# Patient Record
Sex: Male | Born: 1940 | Race: Black or African American | Hispanic: No | Smoking: Never smoker
Health system: Southern US, Community
[De-identification: ages and names within clinical notes are randomized; demographics above are authoritative.]

## PROBLEM LIST (undated history)

## (undated) DIAGNOSIS — I10 Essential (primary) hypertension: Secondary | ICD-10-CM

## (undated) DIAGNOSIS — K56609 Unspecified intestinal obstruction, unspecified as to partial versus complete obstruction: Secondary | ICD-10-CM

## (undated) DIAGNOSIS — I639 Cerebral infarction, unspecified: Secondary | ICD-10-CM

## (undated) DIAGNOSIS — J310 Chronic rhinitis: Secondary | ICD-10-CM

## (undated) DIAGNOSIS — I82409 Acute embolism and thrombosis of unspecified deep veins of unspecified lower extremity: Secondary | ICD-10-CM

## (undated) DIAGNOSIS — E7800 Pure hypercholesterolemia, unspecified: Secondary | ICD-10-CM

## (undated) DIAGNOSIS — Z7189 Other specified counseling: Secondary | ICD-10-CM

## (undated) DIAGNOSIS — D638 Anemia in other chronic diseases classified elsewhere: Secondary | ICD-10-CM

## (undated) DIAGNOSIS — K4030 Unilateral inguinal hernia, with obstruction, without gangrene, not specified as recurrent: Secondary | ICD-10-CM

## (undated) DIAGNOSIS — Z8669 Personal history of other diseases of the nervous system and sense organs: Secondary | ICD-10-CM

## (undated) HISTORY — DX: Essential (primary) hypertension: I10

## (undated) HISTORY — DX: Pure hypercholesterolemia, unspecified: E78.00

## (undated) HISTORY — DX: Unspecified intestinal obstruction, unspecified as to partial versus complete obstruction: K56.609

## (undated) HISTORY — DX: Personal history of other diseases of the nervous system and sense organs: Z86.69

## (undated) HISTORY — DX: Chronic rhinitis: J31.0

## (undated) HISTORY — DX: Other specified counseling: Z71.89

## (undated) HISTORY — DX: Unilateral inguinal hernia, with obstruction, without gangrene, not specified as recurrent: K40.30

## (undated) HISTORY — PX: NO PAST SURGERIES: SHX2092

## (undated) HISTORY — DX: Anemia in other chronic diseases classified elsewhere: D63.8

---

## 2012-03-02 ENCOUNTER — Other Ambulatory Visit: Payer: Self-pay | Admitting: Vascular Surgery

## 2012-03-02 LAB — BASIC METABOLIC PANEL
Anion Gap: 7 (ref 7–16)
BUN: 13 mg/dL (ref 7–18)
Calcium, Total: 9 mg/dL (ref 8.5–10.1)
Chloride: 106 mmol/L (ref 98–107)
Creatinine: 0.81 mg/dL (ref 0.60–1.30)
EGFR (Non-African Amer.): >60
Osmolality: 279 (ref 275–301)
Potassium: 3.6 mmol/L (ref 3.5–5.1)

## 2012-03-03 ENCOUNTER — Ambulatory Visit: Payer: Self-pay | Admitting: Vascular Surgery

## 2013-04-04 ENCOUNTER — Emergency Department: Payer: Self-pay | Admitting: Internal Medicine

## 2013-04-04 LAB — COMPREHENSIVE METABOLIC PANEL
Anion Gap: 9 (ref 7–16)
Bilirubin,Total: 1.1 mg/dL — ABNORMAL HIGH (ref 0.2–1.0)
Calcium, Total: 9.2 mg/dL (ref 8.5–10.1)
Co2: 23 mmol/L (ref 21–32)
Creatinine: 0.78 mg/dL (ref 0.60–1.30)
EGFR (African American): >60
EGFR (Non-African Amer.): >60
SGOT(AST): 26 U/L (ref 15–37)
SGPT (ALT): 21 U/L (ref 12–78)
Sodium: 134 mmol/L — ABNORMAL LOW (ref 136–145)

## 2013-04-04 LAB — URINALYSIS, COMPLETE
Bilirubin,UR: NEGATIVE
Glucose,UR: NEGATIVE mg/dL (ref 0–75)
Leukocyte Esterase: NEGATIVE
Nitrite: NEGATIVE
Protein: NEGATIVE
RBC,UR: NONE SEEN /HPF (ref 0–5)
Specific Gravity: 1.014 (ref 1.003–1.030)

## 2013-04-04 LAB — CBC
MCH: 30.2 pg (ref 26.0–34.0)
MCV: 89 fL (ref 80–100)
Platelet: 278 10*3/uL (ref 150–440)
RDW: 14 % (ref 11.5–14.5)

## 2013-08-19 IMAGING — CT CT ANGIOGRAPHY NECK
1 series · 12 of 14 positions shown · IV contrast (APPLIED)
Comparison: none

REASON FOR EXAM: STROKE   [facility] EYE
COMMENTS:

PROCEDURE:     KCT - KCT ANGIOGRAPHY NECK W/WO  - March 03, 2012  [DATE]
RESULT:     Indication: Stroke
Comparisons: None
TECHNIQUE: 100 ml of Isovue 370 was administered and the extracranial
carotid arteries bilaterally were scanned during the arterial phase from the
level of the superior portion of the frontal sinuses to the proximal neck.
These images were then transferred to the Siemens work station and were
subsequently reviewed utilizing 3-D reconstructions and MIP images.

[Series 6: 3 soft tissue · axial · 0.47mm/px · z∈[+349,+583]mm · 12 of 94 slices shown]
[im 8/94  soft-tissue]
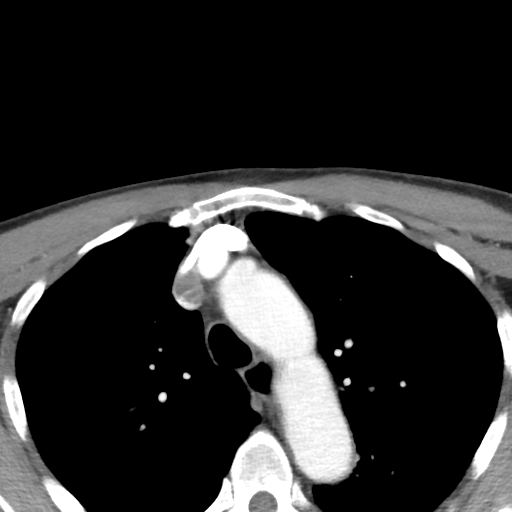
[im 15/94  bone]
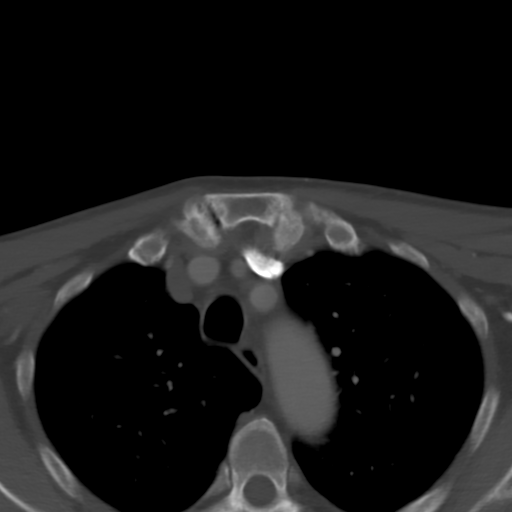
[im 22/94  soft-tissue]
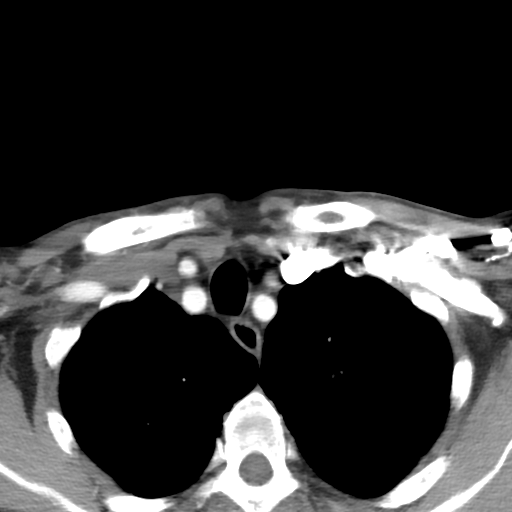
[im 29/94  bone]
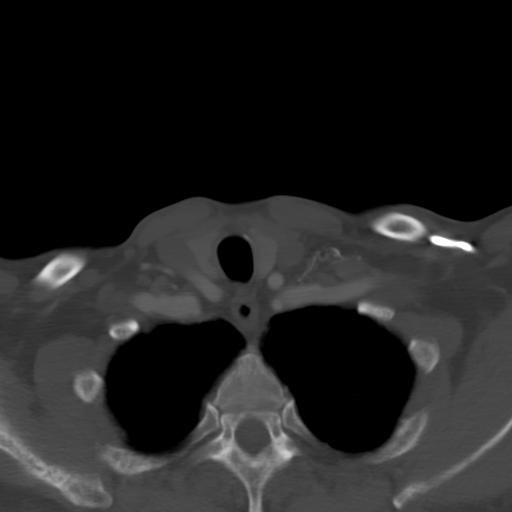
[im 36/94  soft-tissue]
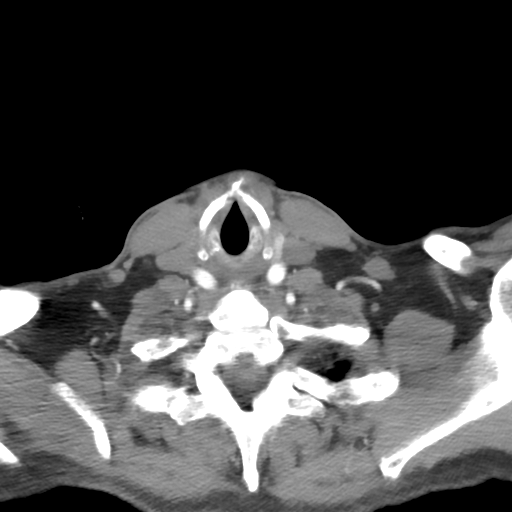
[im 43/94  bone]
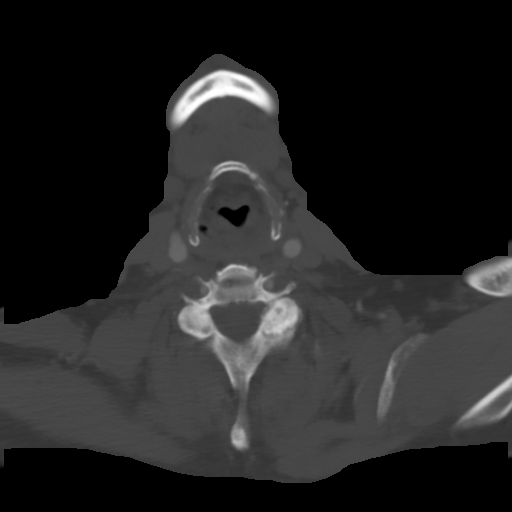
[im 51/94  soft-tissue]
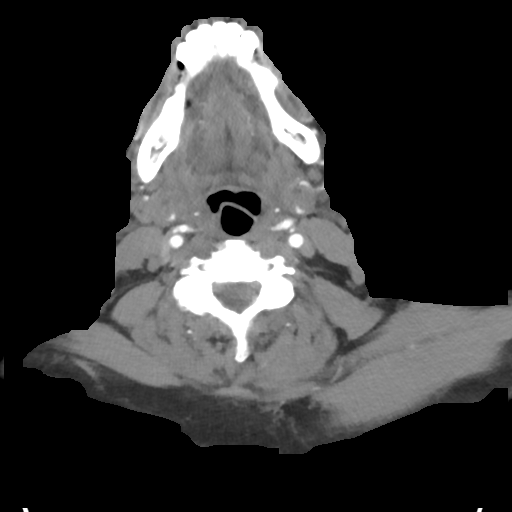
[im 58/94  bone]
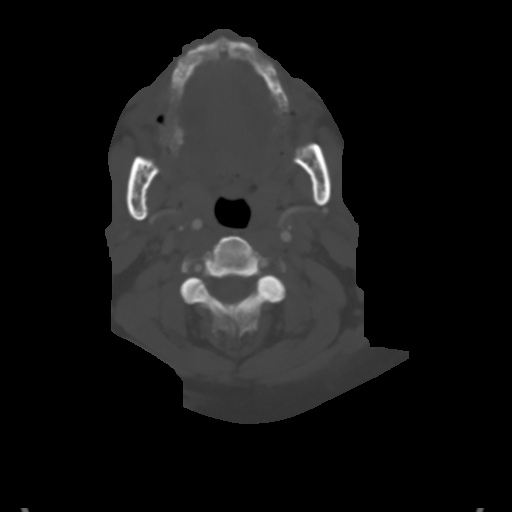
[im 65/94  soft-tissue]
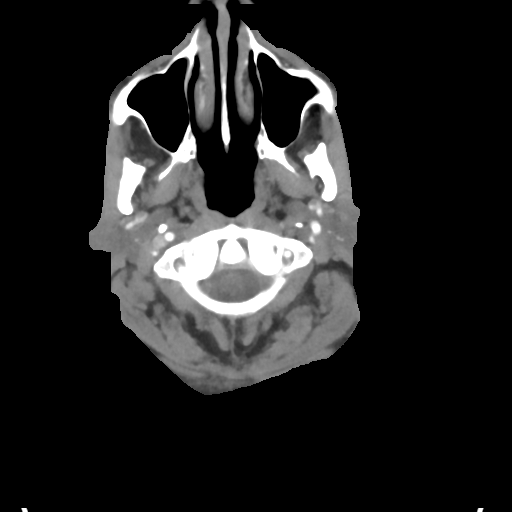
[im 72/94  bone]
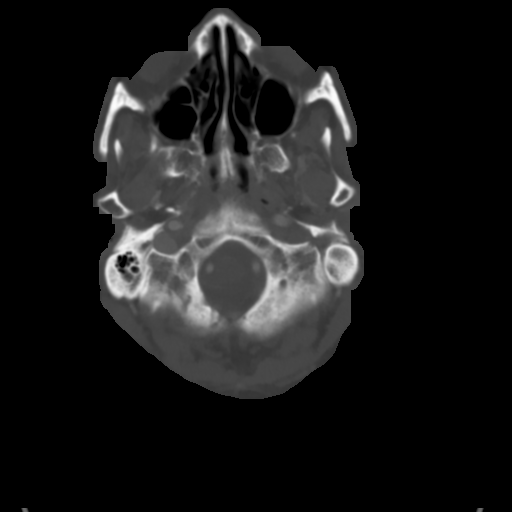
[im 79/94  soft-tissue]
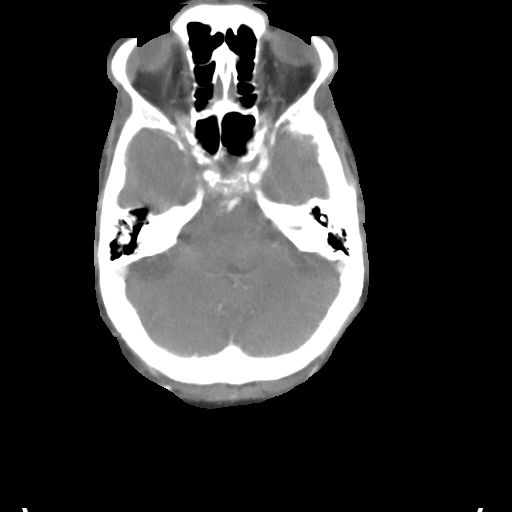
[im 86/94  bone]
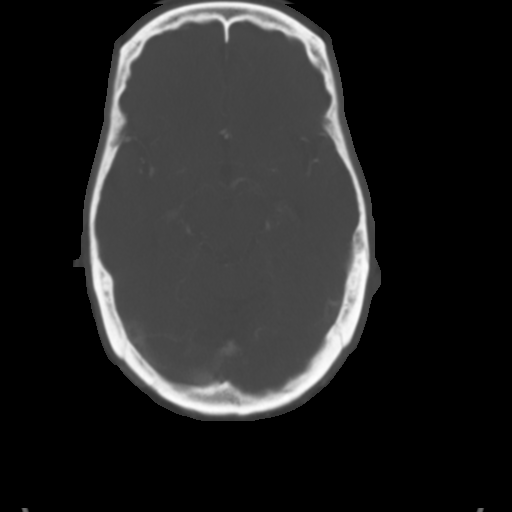

[12 of 14 positions shown; findings below may reference images not displayed]

FINDINGS: A three-vessel configuration of the aortic arch is identified with normal
ostium. The vertebral arteries are identified arising from the subclavian
arteries and entering the transverse foramen bilaterally at C6.

The carotid arteries are identified and are symmetric and unremarkable.
There is no evidence of aneurysm or carotid dissection bilaterally.

Right Carotid Artery: There is no significant atherosclerotic plaque. There
is no focal stenosis.

Left Carotid Artery: There is no significant atherosclerotic plaque. There
is no focal stenosis.

The visualized portions of the brain are unremarkable.

There is degenerative disc disease throughout the cervical spine.

The lung apices are clear.
IMPRESSION: 1. No significant carotid artery stenosis.

[REDACTED]

## 2014-09-20 IMAGING — CT CT HEAD WITHOUT CONTRAST
1 series · 15 of 30 positions shown, 19 images · non-contrast
Comparison: none

REASON FOR EXAM: fall, eye swollen shut, takes aspirin
COMMENTS:

PROCEDURE:     CT  - CT HEAD WITHOUT CONTRAST  - April 04, 2013  [DATE]
RESULT:     Comparison:  None
TECHNIQUE: Multiple axial images from the foramen magnum to the vertex were
obtained without IV contrast.

[Series 2: soft tissue · axial · 0.41mm/px · z∈[-56,+78]mm · 15 of 30 slices shown, 19 images]
[im 2/30  brain]
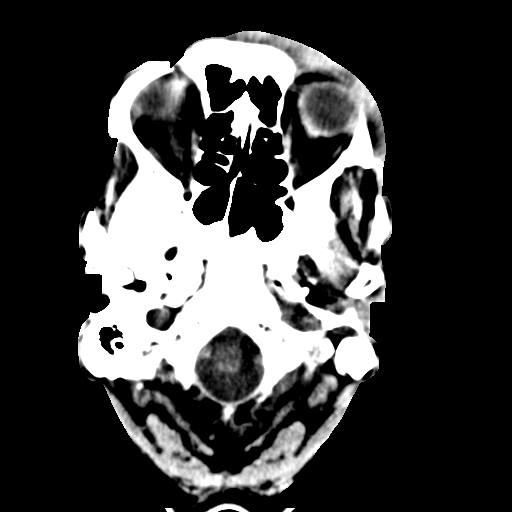
[im 2/30  bone]
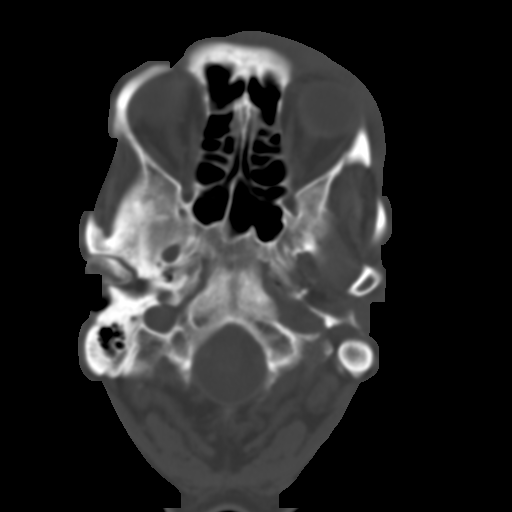
[im 4/30  brain]
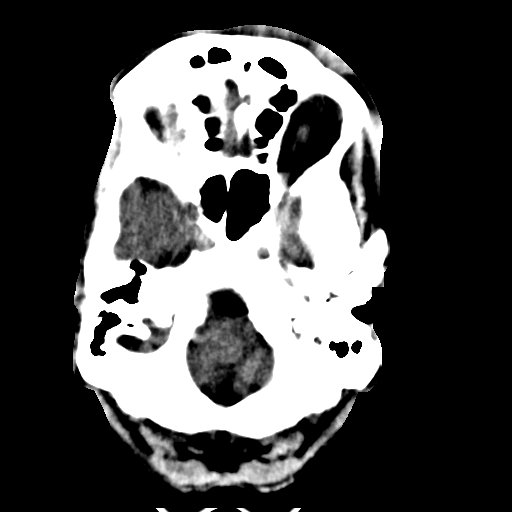
[im 6/30  brain]
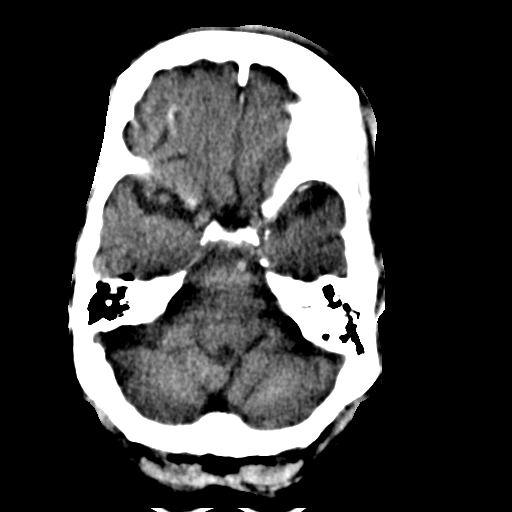
[im 8/30  brain]
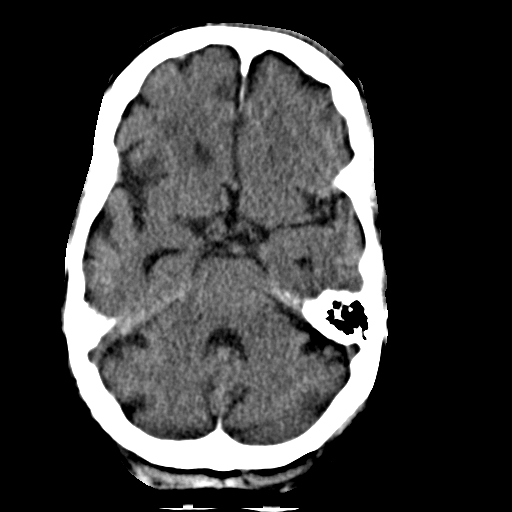
[im 10/30  brain]
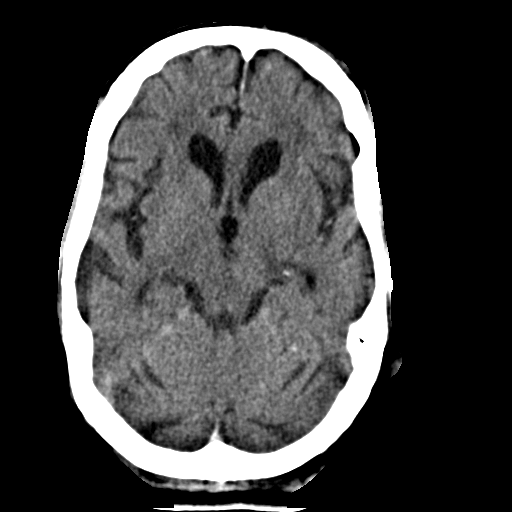
[im 10/30  bone]
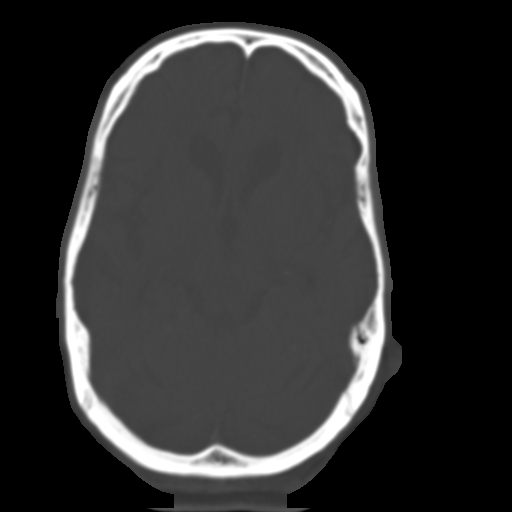
[im 12/30  brain]
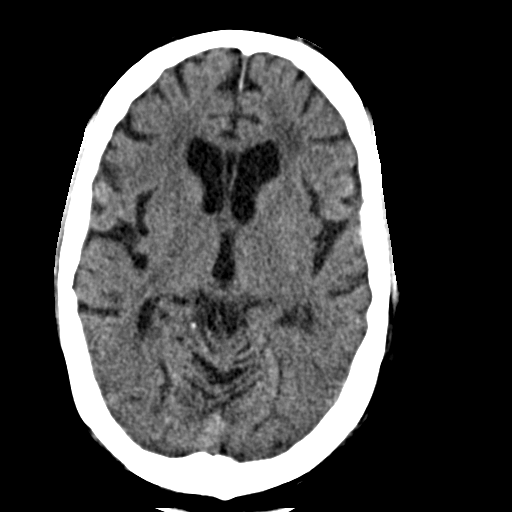
[im 14/30  brain]
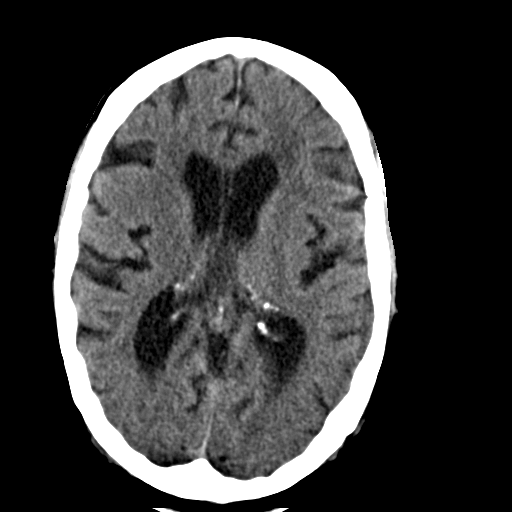
[im 16/30  brain]
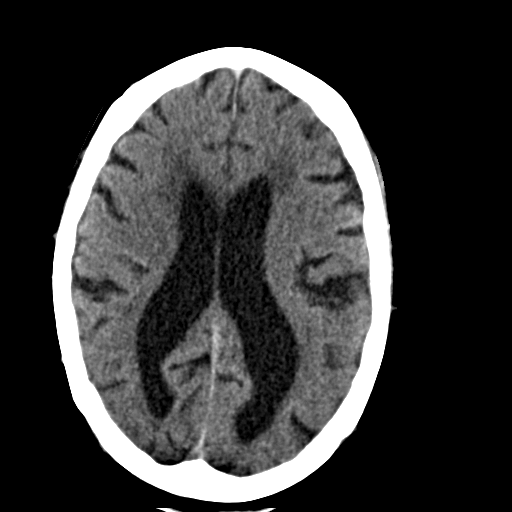
[im 17/30  brain]
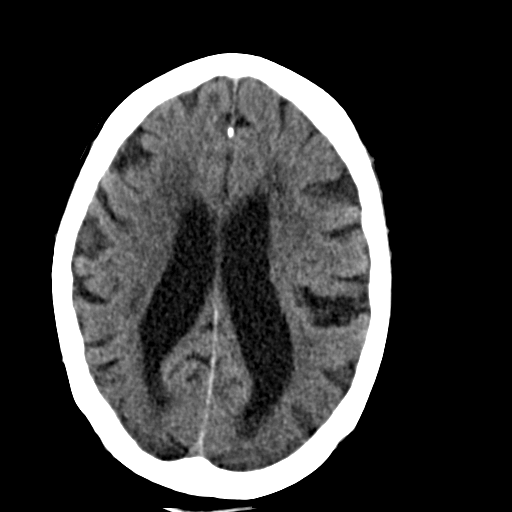
[im 17/30  bone]
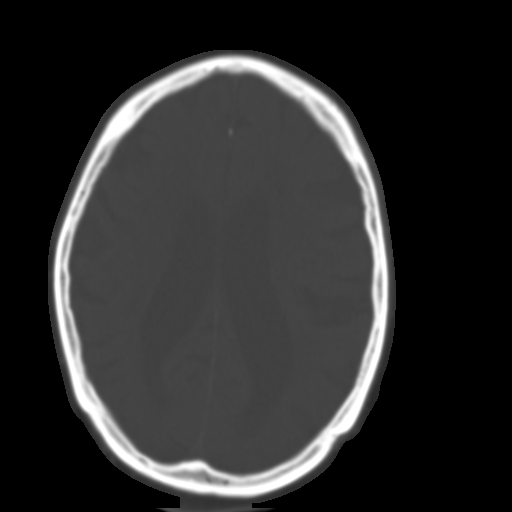
[im 19/30  brain]
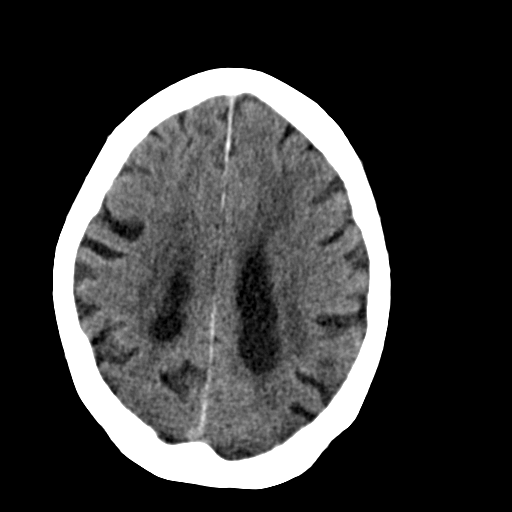
[im 21/30  brain]
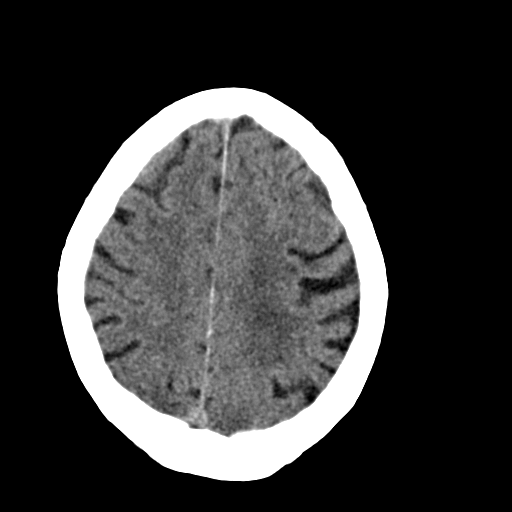
[im 23/30  brain]
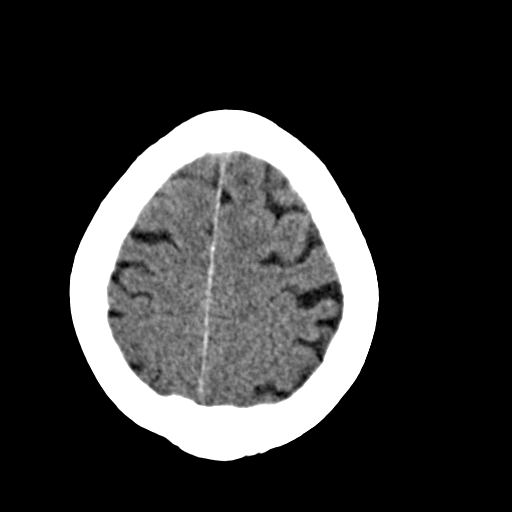
[im 25/30  brain]
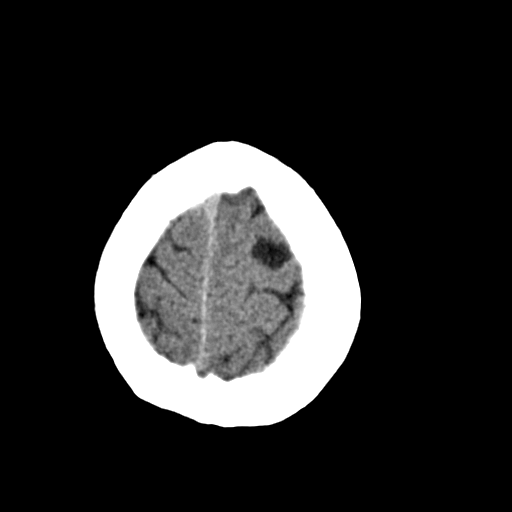
[im 25/30  bone]
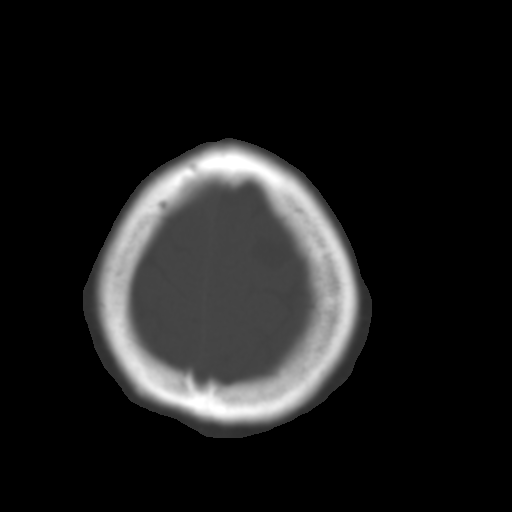
[im 27/30  brain]
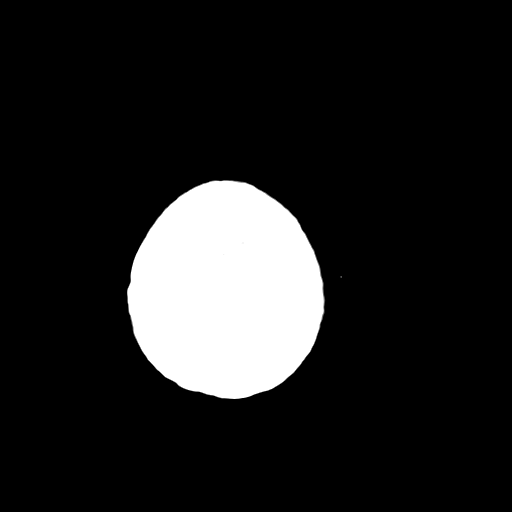
[im 29/30  brain]
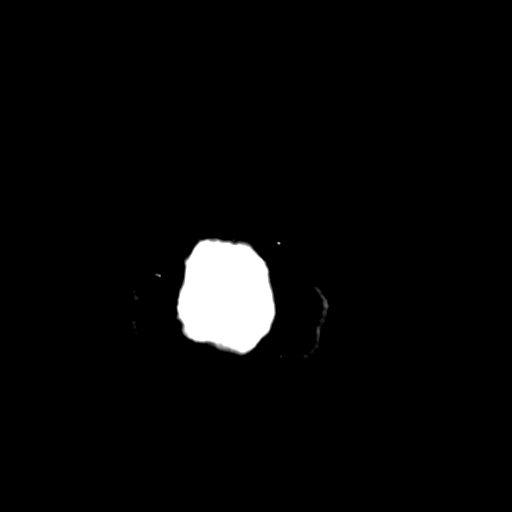

[15 of 30 positions shown; findings below may reference images not displayed]

FINDINGS: There is no evidence of mass effect, midline shift, or extra-axial fluid
collections.  There is no evidence of a space-occupying lesion or
intracranial hemorrhage. There is no evidence of a cortical-based area of
acute infarction. There is generalized cerebral atrophy. There is
periventricular white matter low attenuation likely secondary to
microangiopathy.

The ventricles and sulci are appropriate for the patient's age. The basal
cisterns are patent.

Visualized portions of the orbits are unremarkable. There is bilateral
ethmoid sinus and maxillary sinus mucosal thickening. Cerebrovascular
atherosclerotic calcifications are noted.

The osseous structures are unremarkable. There is severe left periorbital
soft tissue swelling.
IMPRESSION: No acute intracranial process.

[REDACTED]

## 2014-09-20 IMAGING — CT CT MAXILLOFACIAL WITHOUT CONTRAST
1 series · 12 of 30 positions shown, 15 images · non-contrast
Comparison: No comparison

REASON FOR EXAM: facial pain after fall
COMMENTS:

PROCEDURE:     CT  - CT MAXILLOFACIAL AREA WO  - April 04, 2013  [DATE]
RESULT:     History: Fall
TECHNIQUE: Multiple axial images obtained of the maxillofacial bones with
coronal reformatted images provided.

[Series 4: coronal · coronal · 0.29mm/px · 12 of 50 slices shown, 15 images]
[im 4/50  brain]
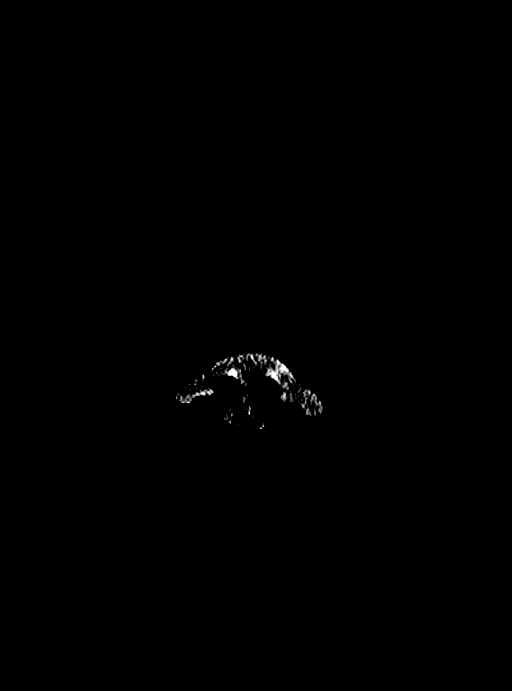
[im 4/50  bone]
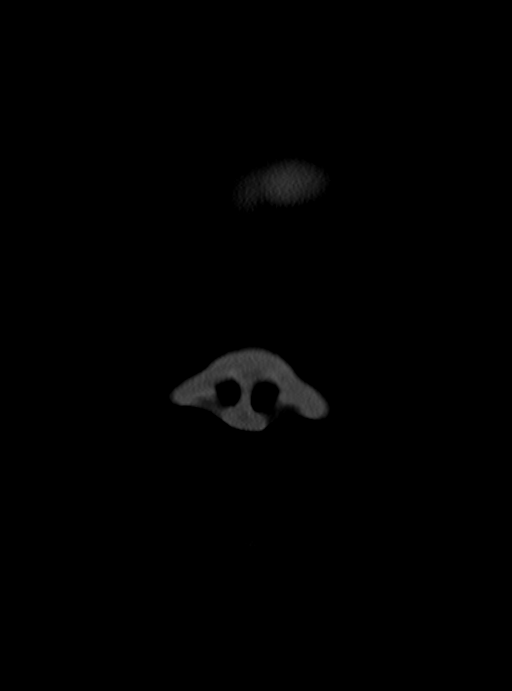
[im 7/50  bone]
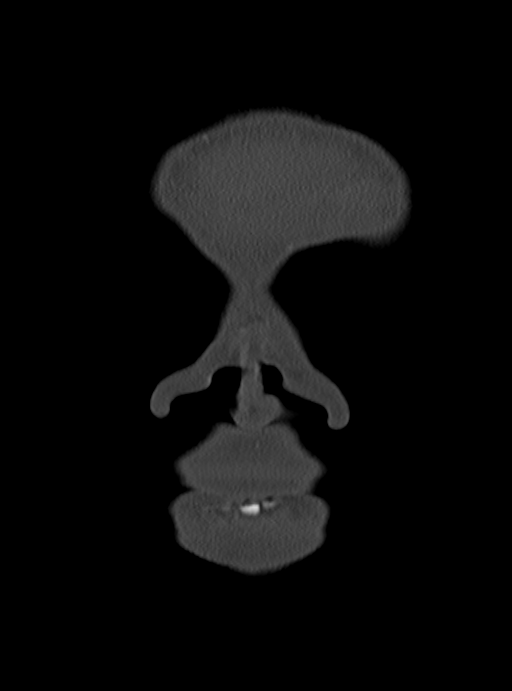
[im 11/50  bone]
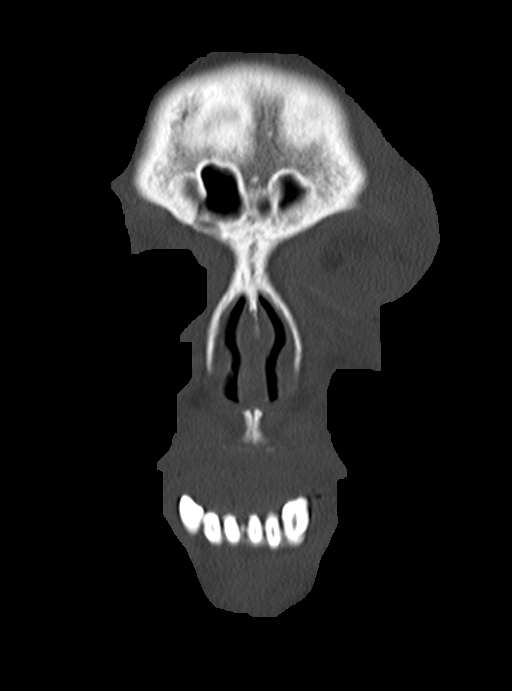
[im 16/50  bone]
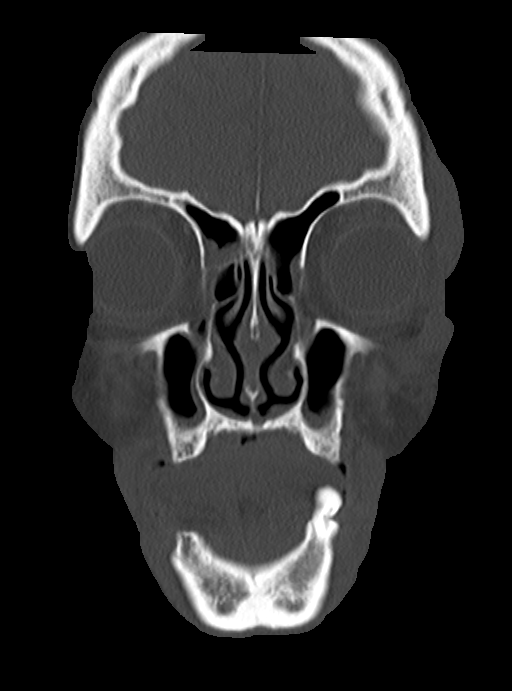
[im 19/50  brain]
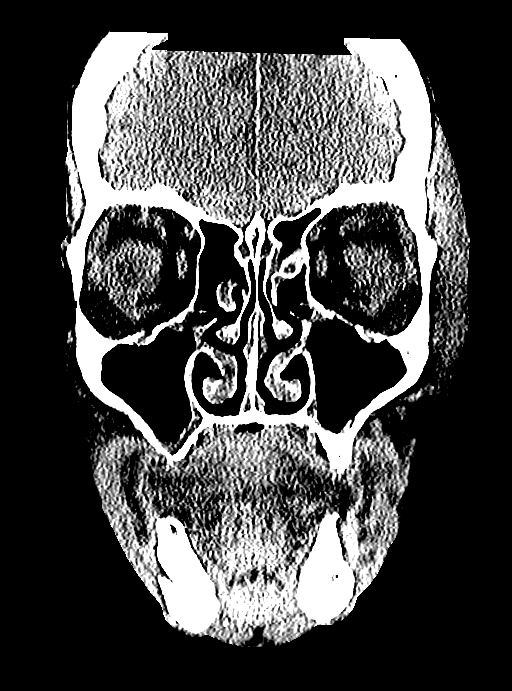
[im 19/50  bone]
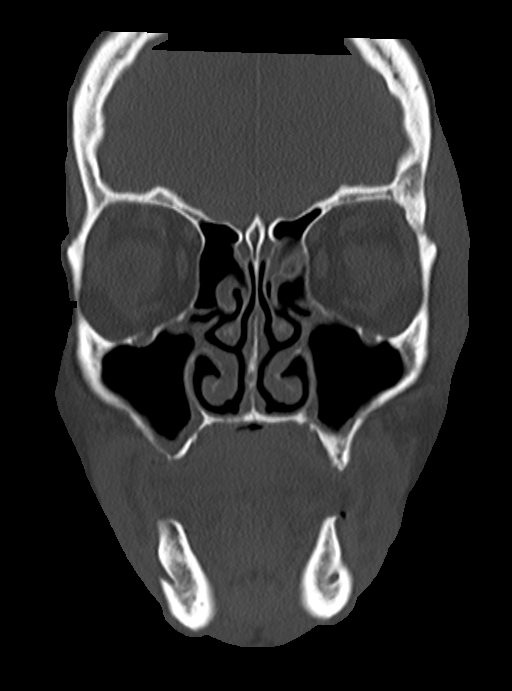
[im 22/50  bone]
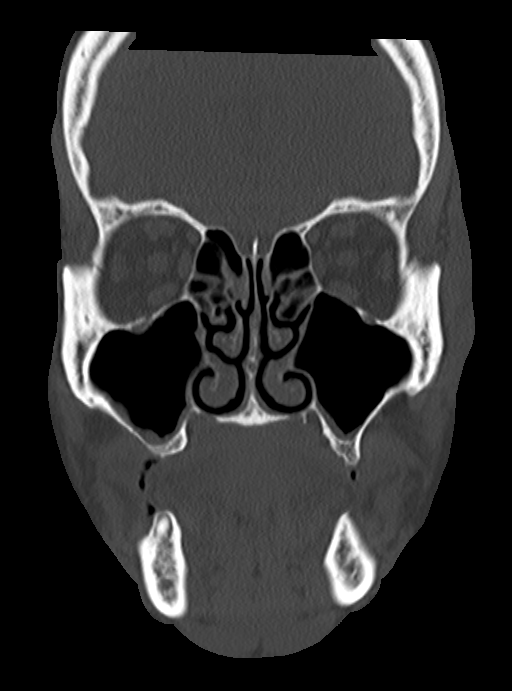
[im 28/50  bone]
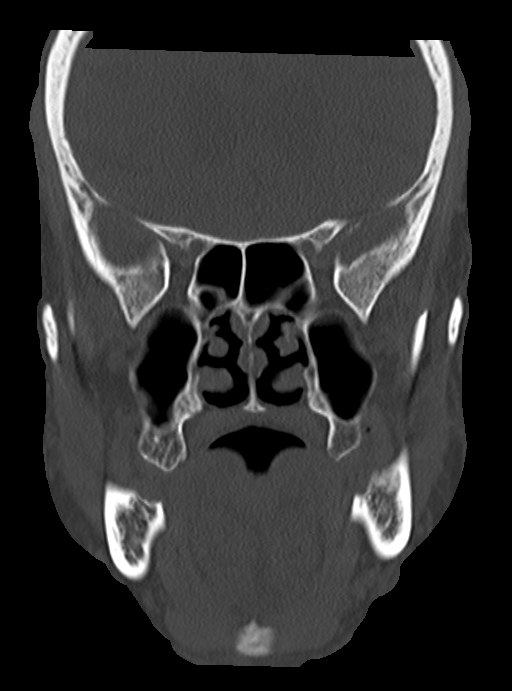
[im 31/50  bone]
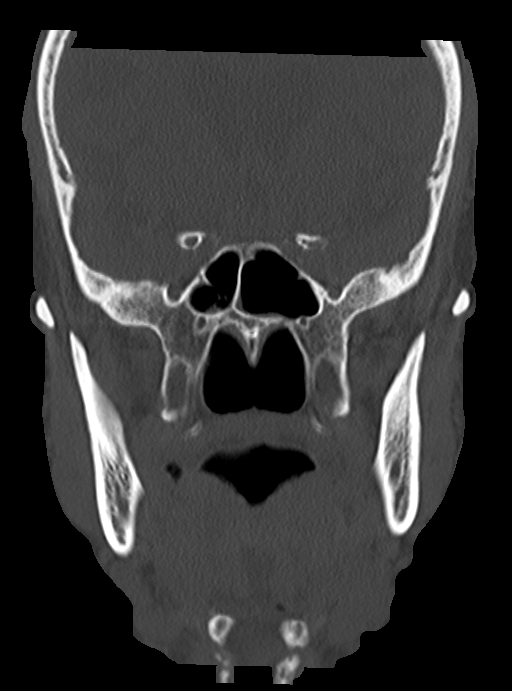
[im 34/50  brain]
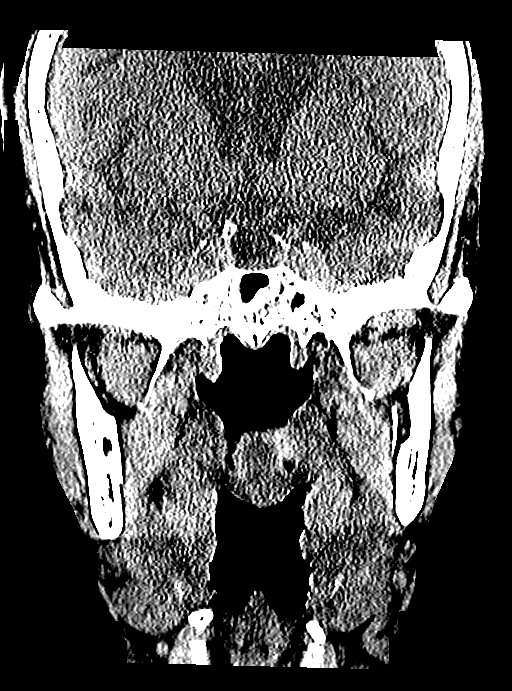
[im 34/50  bone]
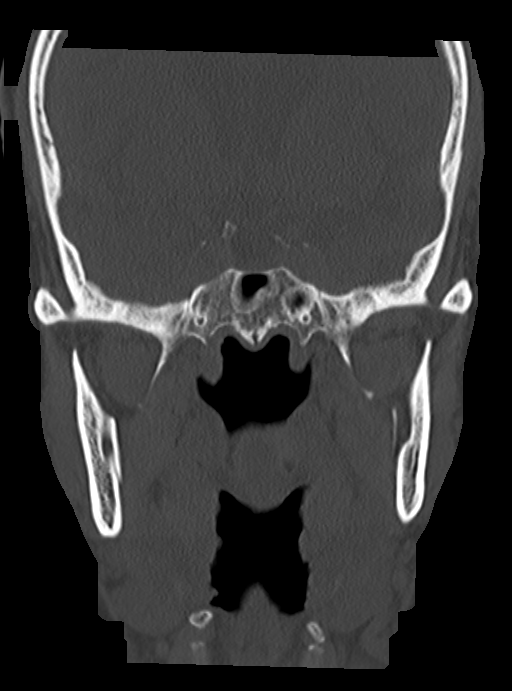
[im 39/50  bone]
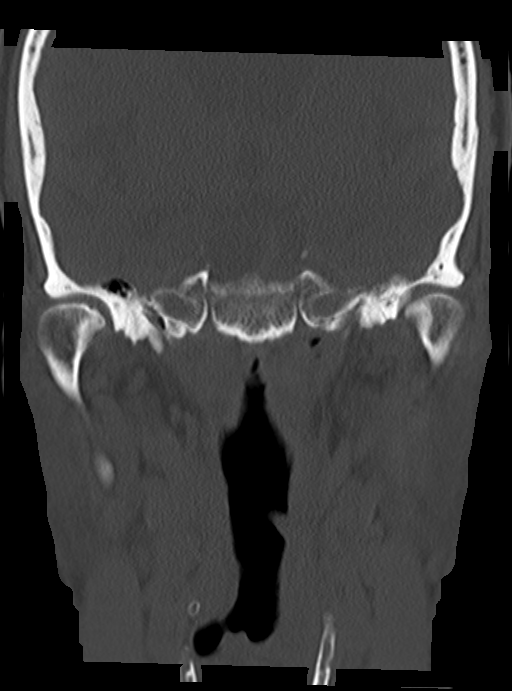
[im 43/50  bone]
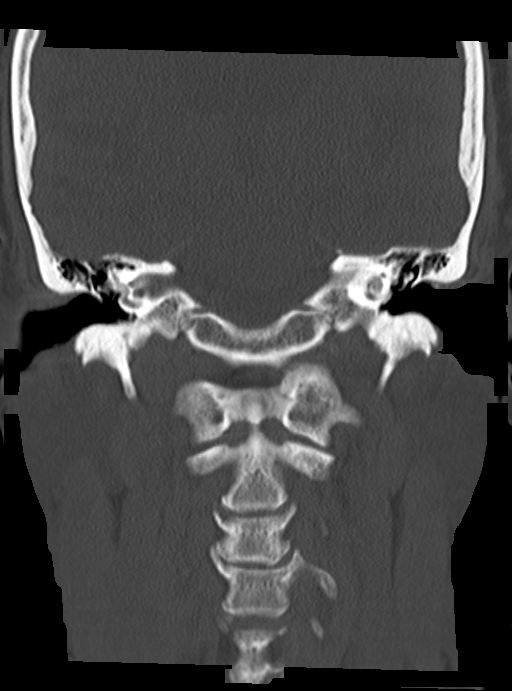
[im 46/50  bone]
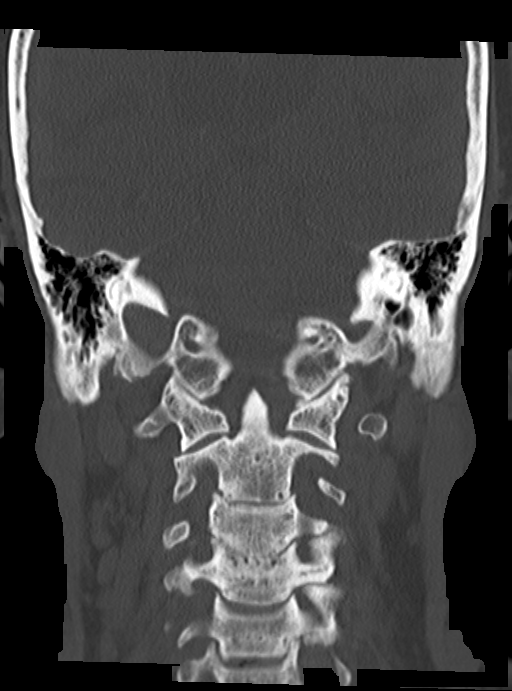

[12 of 30 positions shown; findings below may reference images not displayed]

FINDINGS: There is severe left periorbital soft tissue swelling. The globes are
intact. The orbital walls are intact. The orbital floor is intact. The
maxilla and mandible are intact. The zygomatic arches are intact. The nasal
septum is midline. There is no nasal bone fracture. The temporomandibular
joints are normal.

There is bilateral ethmoid sinus and maxillary sinus mucosal thickening. The
visualized portions of the mastoid sinuses are well aerated.
IMPRESSION: No acute osseous injury of the maxillofacial bones.

[REDACTED]

## 2015-11-21 DIAGNOSIS — D638 Anemia in other chronic diseases classified elsewhere: Secondary | ICD-10-CM | POA: Insufficient documentation

## 2015-11-21 HISTORY — DX: Anemia in other chronic diseases classified elsewhere: D63.8

## 2016-07-29 DIAGNOSIS — Z7189 Other specified counseling: Secondary | ICD-10-CM | POA: Insufficient documentation

## 2016-07-29 DIAGNOSIS — Z7185 Encounter for immunization safety counseling: Secondary | ICD-10-CM

## 2016-07-29 HISTORY — DX: Encounter for immunization safety counseling: Z71.85

## 2017-09-12 ENCOUNTER — Emergency Department: Payer: Medicare Other

## 2017-09-12 ENCOUNTER — Other Ambulatory Visit: Payer: Self-pay

## 2017-09-12 ENCOUNTER — Emergency Department
Admission: EM | Admit: 2017-09-12 | Discharge: 2017-09-12 | Disposition: A | Discharge status: Home / Self Care | Payer: Medicare Other | Attending: Emergency Medicine | Admitting: Emergency Medicine

## 2017-09-12 DIAGNOSIS — K4030 Unilateral inguinal hernia, with obstruction, without gangrene, not specified as recurrent: Secondary | ICD-10-CM | POA: Diagnosis not present

## 2017-09-12 DIAGNOSIS — K56609 Unspecified intestinal obstruction, unspecified as to partial versus complete obstruction: Secondary | ICD-10-CM | POA: Insufficient documentation

## 2017-09-12 DIAGNOSIS — I10 Essential (primary) hypertension: Secondary | ICD-10-CM | POA: Insufficient documentation

## 2017-09-12 DIAGNOSIS — R1010 Upper abdominal pain, unspecified: Secondary | ICD-10-CM | POA: Diagnosis present

## 2017-09-12 DIAGNOSIS — Z87891 Personal history of nicotine dependence: Secondary | ICD-10-CM | POA: Insufficient documentation

## 2017-09-12 HISTORY — DX: Essential (primary) hypertension: I10

## 2017-09-12 LAB — CBC
HCT: 41.7 % (ref 40.0–52.0)
Hemoglobin: 13.6 g/dL (ref 13.0–18.0)
MCH: 29.9 pg (ref 26.0–34.0)
MCHC: 32.7 g/dL (ref 32.0–36.0)
MCV: 91.5 fL (ref 80.0–100.0)
PLATELETS: 243 10*3/uL (ref 150–440)
RBC: 4.56 MIL/uL (ref 4.40–5.90)
RDW: 14.4 % (ref 11.5–14.5)
WBC: 6 10*3/uL (ref 3.8–10.6)

## 2017-09-12 LAB — COMPREHENSIVE METABOLIC PANEL
ALK PHOS: 39 U/L (ref 38–126)
ALT: 16 U/L — AB (ref 17–63)
AST: 26 U/L (ref 15–41)
Albumin: 4.3 g/dL (ref 3.5–5.0)
Anion gap: 13 (ref 5–15)
BILIRUBIN TOTAL: 2.3 mg/dL — AB (ref 0.3–1.2)
BUN: 10 mg/dL (ref 6–20)
CALCIUM: 9.2 mg/dL (ref 8.9–10.3)
CO2: 20 mmol/L — ABNORMAL LOW (ref 22–32)
CREATININE: 0.85 mg/dL (ref 0.61–1.24)
Chloride: 106 mmol/L (ref 101–111)
GFR calc Af Amer: >60 mL/min (ref >60)
Glucose, Bld: 155 mg/dL — ABNORMAL HIGH (ref 65–99)
Potassium: 3.2 mmol/L — ABNORMAL LOW (ref 3.5–5.1)
Sodium: 139 mmol/L (ref 135–145)
TOTAL PROTEIN: 7.4 g/dL (ref 6.5–8.1)

## 2017-09-12 LAB — LIPASE, BLOOD: Lipase: 21 U/L (ref 11–51)

## 2017-09-12 MED ORDER — SODIUM CHLORIDE 0.9 % IV BOLUS (SEPSIS)
1000.0000 mL | Freq: Once | INTRAVENOUS | Status: AC
  Administered 2017-09-12: 1000 mL via INTRAVENOUS

## 2017-09-12 MED ORDER — IOPAMIDOL (ISOVUE-300) INJECTION 61%
100.0000 mL | Freq: Once | INTRAVENOUS | Status: AC | PRN
  Administered 2017-09-12: 100 mL via INTRAVENOUS

## 2017-09-12 NOTE — Consult Note (Signed)
Date of Consultation:  09/12/2017  Requesting Physician:  Larae Grooms, MD  Reason for Consultation:   Incarcerated right inguinal hernia  History of Present Illness: Jimmy Blair is a 76 y.o. male who presents with a month history of a "growth" in his right groin, as well as two weeks of intermittent nausea and vomiting.  He was seen by his PCP about a month ago but he did not report his growth and this was not checked on exam.  Patient reports that he feels fuller and with decreased appetite.  This is associated with abdominal discomfort, though I am unable to get full details on the quality of his discomfort.  He denies any groin pain.  He had a bowel movement today but had emesis yesterday.    In the ED, had a CT scan which showed a large right inguinal hernia containing small bowel, causing a bowel obstruction as well as concern for possible closed loop obstruction with mesenteric swirl.  Past Medical History: Past Medical History:  Diagnosis Date  . Hypertension Hyperlipidemia      Past Surgical History: --None  Home Medications: Prior to Admission medications   Medication Sig Start Date End Date Taking? Authorizing Provider  aspirin EC 81 MG tablet Take 1 tablet daily by mouth.   Yes [provider]  atorvastatin (LIPITOR) 20 MG tablet Take 1 tablet daily by mouth. 05/09/17  Yes [provider]  lisinopril (PRINIVIL,ZESTRIL) 40 MG tablet Take 1 tablet daily by mouth. 07/13/17  Yes [provider]    Allergies: No Known Allergies  Social History:  reports that he has quit smoking. He does not have any smokeless tobacco history on file. He reports that he drinks alcohol. His drug history is not on file.   Family History: --Patient unable to tell me family history.  Review of Systems: Review of Systems  Constitutional: Negative for chills and fever.  HENT: Negative for hearing loss.   Eyes: Negative for blurred vision.  Respiratory:  Negative for shortness of breath.   Cardiovascular: Negative for chest pain.  Gastrointestinal: Positive for abdominal pain, nausea and vomiting. Negative for blood in stool and constipation.  Genitourinary: Negative for dysuria.  Musculoskeletal: Negative for myalgias.  Skin: Negative for rash.  Neurological: Negative for dizziness.  Psychiatric/Behavioral: Negative for depression.  All other systems reviewed and are negative.   Physical Exam BP (!) 122/48 (BP Location: Right Arm)   Pulse 79   Temp 98 F (36.7 C) (Oral)   Resp 16   Ht 6' (1.829 m)   Wt 72.6 kg (160 lb)   SpO2 97%   BMI 21.70 kg/m  CONSTITUTIONAL: No acute distress HEENT:  Normocephalic, atraumatic, extraocular motion intact. NECK: Trachea is midline, and there is no jugular venous distension.  RESPIRATORY:  Lungs are clear, and breath sounds are equal bilaterally. Normal respiratory effort without pathologic use of accessory muscles. CARDIOVASCULAR: Heart is regular without murmurs, gallops, or rubs. GI: The abdomen is soft, minimally distended, nontender to palpation.  Patient has a moderate sized right inguinal hernia with intestine reaching the right scrotum.  After careful manipulation, the hernia was reduced revealing no residual bulging.  There was no tenderness to the abdomen or groin.  MUSCULOSKELETAL:  Normal muscle strength and tone in all four extremities.  No peripheral edema or cyanosis. SKIN: Skin turgor is normal. There are no pathologic skin lesions.  NEUROLOGIC:  Motor and sensation is grossly normal.  Cranial nerves are grossly intact. PSYCH:  Alert  and oriented to person, place and time. Affect is normal.  Laboratory Analysis: Results for orders placed or performed during the hospital encounter of 09/12/17 (from the past 24 hour(s))  Lipase, blood     Status: None   Collection Time: 09/12/17 11:14 AM  Result Value Ref Range   Lipase 21 11 - 51 U/L  Comprehensive metabolic panel     Status:  Abnormal   Collection Time: 09/12/17 11:14 AM  Result Value Ref Range   Sodium 139 135 - 145 mmol/L   Potassium 3.2 (L) 3.5 - 5.1 mmol/L   Chloride 106 101 - 111 mmol/L   CO2 20 (L) 22 - 32 mmol/L   Glucose, Bld 155 (H) 65 - 99 mg/dL   BUN 10 6 - 20 mg/dL   Creatinine, Ser 0.85 0.61 - 1.24 mg/dL   Calcium 9.2 8.9 - 10.3 mg/dL   Total Protein 7.4 6.5 - 8.1 g/dL   Albumin 4.3 3.5 - 5.0 g/dL   AST 26 15 - 41 U/L   ALT 16 (L) 17 - 63 U/L   Alkaline Phosphatase 39 38 - 126 U/L   Total Bilirubin 2.3 (H) 0.3 - 1.2 mg/dL   GFR calc non Af Amer >60 >60 mL/min   GFR calc Af Amer >60 >60 mL/min   Anion gap 13 5 - 15  CBC     Status: None   Collection Time: 09/12/17 11:14 AM  Result Value Ref Range   WBC 6.0 3.8 - 10.6 K/uL   RBC 4.56 4.40 - 5.90 MIL/uL   Hemoglobin 13.6 13.0 - 18.0 g/dL   HCT 41.7 40.0 - 52.0 %   MCV 91.5 80.0 - 100.0 fL   MCH 29.9 26.0 - 34.0 pg   MCHC 32.7 32.0 - 36.0 g/dL   RDW 14.4 11.5 - 14.5 %   Platelets 243 150 - 440 K/uL    Imaging: Ct Abdomen Pelvis W Contrast  Result Date: 09/12/2017 CLINICAL DATA:  Abdominal pain and vomiting EXAM: CT ABDOMEN AND PELVIS WITH CONTRAST TECHNIQUE: Multidetector CT imaging of the abdomen and pelvis was performed using the standard protocol following bolus administration of intravenous contrast. CONTRAST:  183mL ISOVUE-300 IOPAMIDOL (ISOVUE-300) INJECTION 61% COMPARISON:  None. FINDINGS: Lower chest: No pulmonary nodules or pleural effusion. No visible pericardial effusion. Hepatobiliary: Normal hepatic contours and density. No visible biliary dilatation. Normal gallbladder. Pancreas: Normal contours without ductal dilatation. No peripancreatic fluid collection. Spleen: Normal. Adrenals/Urinary Tract: --Adrenal glands: Normal right adrenal gland. 1.4 cm intermediate attenuation left adrenal lesion. --Right kidney/ureter: No hydronephrosis or perinephric stranding. No nephrolithiasis. No obstructing ureteral stones. --Left  kidney/ureter: No hydronephrosis or perinephric stranding. No nephrolithiasis. No obstructing ureteral stones. --Urinary bladder: Unremarkable. Stomach/Bowel: --Stomach/Duodenum: No hiatal hernia or other gastric abnormality. Normal duodenal course and caliber. --Small bowel: There is dilated small bowel within a large right inguinal hernia extending into the scrotum. Additionally, there is swirling of the right lower quadrant mesenteric vascularity. The dilated small bowel extends into this area. --Colon: No focal abnormality. --Appendix: Normal. Vascular/Lymphatic: Twisting of the right lower quadrant mesenteric vascularity. There is atherosclerotic calcification of the non aneurysmal abdominal aorta. No abdominal or pelvic lymphadenopathy. Reproductive: The prostate is enlarged, measuring 5.5 x 4.4 cm. Musculoskeletal. Multilevel degenerative disc disease and facet arthrosis. No bony spinal canal stenosis. Other: None. IMPRESSION: 1. Dilated right lower quadrant small bowel with twisting of the right lower quadrant mesenteric vascularity is concerning for a closed loop small bowel obstruction. No pneumatosis or free intraperitoneal  air. 2. Dilated small bowel within the right inguinal canal extends into the scrotum and is worrisome for incarcerated inguinal hernia. 3.  Aortic Atherosclerosis (ICD10-I70.0). 4. Intermediate attenuation left adrenal lesion. This could be further characterized with multiphase adrenal protocol CT or abdominal MRI on a nonemergent basis. These results were called by telephone at the time of interpretation on 09/12/2017 at 4:42 pm to Dr. Larae Grooms , who verbally acknowledged these results. Electronically Signed   By: Ulyses Jarred M.D.   On: 09/12/2017 16:46    Assessment and Plan: This is a 76 y.o. male who presents with an incarcerated right inguinal hernia which was successfully reduced at bedside.  I have independently viewed the patient's imaging study and reviewed his  laboratory studies.  Overall, the CT shows a right inguinal hernia and as intestine is bulging through, it turns in a way that looks like mesenteric swirl at its base and also causes an obstruction.  However, the patient had no tenderness on exam and only some discomfort with manipulation of the hernia contents while reducing the hernia.  Discussed with the patient that given the successful reduction of his hernia, his minimal symptoms and stable vital signs, and his reassuring labs, that there is no emergency to rush him to the operating room and repair his hernia.  I offered admission to hospital and hernia repair on semi-elective basis tomorrow vs discharge to home.  However, given that the hernia did cause these findings, would still recommend on repair on an elective basis.  He has elected to be discharged to home and come to office next week to schedule surgery as outpatient.  In the meantime, discussed with him and his family return precautions that would be consistent with incarcerated or strangulated hernia.  Suggested that he can use a hernia truss or underwear to help keep hernia contents reduced.  He will continue his IV fluids in ED and finish a liter of hydration prior to discharge.  Recommended that he take clear liquids today given bowel distention on CT scan and tomorrow can resume regular diet.  Patient understands this plan and all of his questions have been answered.   Melvyn Neth, Beat[place]

## 2017-09-12 NOTE — ED Provider Notes (Addendum)
[facility] Emergency Department Provider Note  ____________________________________________   First MD Initiated Contact with Patient 09/12/17 1500     (approximate)  I have reviewed the triage vital signs and the nursing notes.   HISTORY  Chief Complaint Abdominal Pain   HPI Jimmy Blair is a 76 y.o. male with a history of hypertension who is complaining of several weeks of upper abdominal pain now associate with nausea and vomiting.  He says the pain has been ongoing for 3-4 weeks and feels like a cramping pain to his upper abdomen which is intermittent.  He is denying any pain at this time.  Says that he has been able to move his bowels.  However, over the past 2 weeks he has had intermittent vomiting.  Denies any unintentional weight loss.  Does not report any pain with urination.   Past Medical History:  Diagnosis Date  . Hypertension     There are no active problems to display for this patient.   History reviewed. No pertinent surgical history.  Prior to Admission medications   Not on File    Allergies Patient has no known allergies.  No family history on file.  Social History Social History   Tobacco Use  . Smoking status: Former Smoker  Substance Use Topics  . Alcohol use: Yes    Frequency: Never  . Drug use: Not on file    Review of Systems  Constitutional: No fever/chills Eyes: No visual changes. ENT: No sore throat. Cardiovascular: Denies chest pain. Respiratory: Denies shortness of breath. Gastrointestinal: No diarrhea.  No constipation. Genitourinary: Negative for dysuria. Musculoskeletal: Negative for back pain. Skin: Negative for rash. Neurological: Negative for headaches, focal weakness or numbness.   ____________________________________________   PHYSICAL EXAM:  VITAL SIGNS: ED Triage Vitals  Enc Vitals Group     BP 09/12/17 1111 (!) 122/48     Pulse Rate 09/12/17 1111 79     Resp 09/12/17 1111 16      Temp 09/12/17 1111 98 F (36.7 C)     Temp Source 09/12/17 1111 Oral     SpO2 09/12/17 1111 97 %     Weight 09/12/17 1112 160 lb (72.6 kg)     Height 09/12/17 1112 6' (1.829 m)     Head Circumference --      Peak Flow --      Pain Score 09/12/17 1111 8     Pain Loc --      Pain Edu? --      Excl. in [place]? --     Constitutional: Alert and oriented. Well appearing and in no acute distress. Eyes: Conjunctivae are normal.  Head: Atraumatic. Nose: No congestion/rhinnorhea. Mouth/Throat: Mucous membranes are moist.  Neck: No stridor.   Cardiovascular: Normal rate, regular rhythm. Grossly normal heart sounds. Respiratory: Normal respiratory effort.  No retractions. Lungs CTAB. Gastrointestinal: Soft and nontender. No distention.  Genitourinary: Patient with mass to the right groin extending into the right hemiscrotum.  Mild tenderness to palpation.  Unable to reduce. Musculoskeletal: No lower extremity tenderness nor edema.  No joint effusions. Neurologic:  Normal speech and language. No gross focal neurologic deficits are appreciated. Skin:  Skin is warm, dry and intact. No rash noted. Psychiatric: Mood and affect are normal. Speech and behavior are normal.  ____________________________________________   LABS (all labs ordered are listed, but only abnormal results are displayed)  Labs Reviewed  COMPREHENSIVE METABOLIC PANEL - Abnormal; Notable for the following components:  Result Value   Potassium 3.2 (*)    CO2 20 (*)    Glucose, Bld 155 (*)    ALT 16 (*)    Total Bilirubin 2.3 (*)    All other components within normal limits  LIPASE, BLOOD  CBC  URINALYSIS, COMPLETE (UACMP) WITH MICROSCOPIC   ____________________________________________  EKG   ____________________________________________  RADIOLOGY  IMPRESSION: 1. Dilated right lower quadrant small bowel with twisting of the right lower quadrant mesenteric vascularity is concerning for a closed loop  small bowel obstruction. No pneumatosis or free intraperitoneal air. 2. Dilated small bowel within the right inguinal canal extends into the scrotum and is worrisome for incarcerated inguinal hernia. 3. Aortic Atherosclerosis (ICD10-I70.0). 4. Intermediate attenuation left adrenal lesion. This could be further characterized with multiphase adrenal protocol CT or abdominal MRI on a nonemergent basis. These results were called by telephone at the time of interpretation on 09/12/2017 at 4:42 pm to Dr. Larae Grooms , who verbally acknowledged these results.   Electronically Signed By: Ulyses Jarred M.D. On: 09/12/2017 16:46 ____________________________________________   PROCEDURES  Procedure(s) performed:   Procedures  Critical Care performed:   ____________________________________________   INITIAL IMPRESSION / ASSESSMENT AND PLAN / ED COURSE  Pertinent labs & imaging results that were available during my care of the patient were reviewed by me and considered in my medical decision making (see chart for details).  Differential diagnosis includes, but is not limited to, biliary disease (biliary colic, acute cholecystitis, cholangitis, choledocholithiasis, etc), intrathoracic causes for epigastric abdominal pain including ACS, gastritis, duodenitis, pancreatitis, small bowel or large bowel obstruction, abdominal aortic aneurysm, hernia, and gastritis.  As part of my medical decision making, I reviewed the following data within the electronic MEDICAL RECORD NUMBER Notes from prior ED visits  ----------------------------------------- 5:33 PM on 09/12/2017 -----------------------------------------  Discussed case with the surgeon, Dr. Hampton Abbot, who will be admitting the patient.  Also discussed the case with the patient and his daughter understand the need for admission to the hospital and are willing to comply.      ____________________________________________   FINAL  CLINICAL IMPRESSION(S) / ED DIAGNOSES  Incarcerated right inguinal hernia with associated small bowel obstruction.    NEW MEDICATIONS STARTED DURING THIS VISIT:  This SmartLink is deprecated. Use AVSMEDLIST instead to display the medication list for a patient.   Note:  This document was prepared using Dragon voice recognition software and may include unintentional dictation errors.     Alistar Mcenery, Randall An, MD 09/12/17 1734  Dr. Hampton Abbot was able to reduce the hernia.  The patient is currently asymptomatic.  Surgery discussed with the patient whether he would like to have this treated outpatient or inpatient and the patient would like to have this treated outpatient.  I reexamined the patient and his hernia is completely reduced.  He is nontender to the groin as well as abdomen.  He does not feel nauseous.  He has no scrotal masses.  We will discharge the patient at this time.  I discussed with the patient as well as his family to return immediately for any worsening or concerning symptoms especially return to the hernia or abdominal pain with nausea and vomiting.  They are understanding of the plan willing to comply.    Orbie Pyo, MD 09/12/17 Tresa Moore

## 2017-09-12 NOTE — ED Notes (Signed)
Pt requests to be discharged prior to fluids finishing. Request honored

## 2017-09-12 NOTE — ED Notes (Signed)
Pt requested to use urinal but was unable to void

## 2017-09-12 NOTE — ED Notes (Signed)
Pt reports generalized abd pain that started several weeks ago - reports vomited x1 this am (he states he vomited because he took asa on an empty stomach) - denies diarrhed - denies diff or freq with urination

## 2017-09-12 NOTE — ED Triage Notes (Signed)
Pt reports lower abdominal pain for 2 days. Reports that pain was in testicles but is only in abdomen at this time. Denies testicle pain or swelling. Pain around umbilicus. Pt alert and oriented X4, active, cooperative, pt in NAD. RR even and unlabored, color WNL.

## 2017-09-12 NOTE — ED Notes (Signed)
Pt is aware that urine sample is needed. 

## 2017-09-13 ENCOUNTER — Telehealth: Payer: Self-pay | Admitting: Surgery

## 2017-09-13 NOTE — Telephone Encounter (Signed)
I have called patient to make an appointment with Dr Hampton Abbot to discuss elective hernia repair. Patient was seen in [facility] ED on 09/12/17. Patient states that he does not want to make an appointment at this time. He will call our office if he would like to be seen in the future.

## 2017-10-03 DIAGNOSIS — J310 Chronic rhinitis: Secondary | ICD-10-CM

## 2017-10-03 DIAGNOSIS — I10 Essential (primary) hypertension: Secondary | ICD-10-CM

## 2017-10-03 DIAGNOSIS — E7800 Pure hypercholesterolemia, unspecified: Secondary | ICD-10-CM

## 2017-10-03 DIAGNOSIS — Z8669 Personal history of other diseases of the nervous system and sense organs: Secondary | ICD-10-CM | POA: Insufficient documentation

## 2017-10-03 HISTORY — DX: Personal history of other diseases of the nervous system and sense organs: Z86.69

## 2017-10-03 HISTORY — DX: Essential (primary) hypertension: I10

## 2017-10-03 HISTORY — DX: Chronic rhinitis: J31.0

## 2017-10-03 HISTORY — DX: Pure hypercholesterolemia, unspecified: E78.00

## 2017-10-04 ENCOUNTER — Telehealth: Payer: Self-pay

## 2017-10-04 ENCOUNTER — Ambulatory Visit (INDEPENDENT_AMBULATORY_CARE_PROVIDER_SITE_OTHER): Payer: Medicare Other | Admitting: Surgery

## 2017-10-04 ENCOUNTER — Encounter: Payer: Self-pay | Admitting: Surgery

## 2017-10-04 VITALS — BP 141/85 | HR 97 | Temp 98.3°F | Wt 146.0 lb

## 2017-10-04 DIAGNOSIS — K4030 Unilateral inguinal hernia, with obstruction, without gangrene, not specified as recurrent: Secondary | ICD-10-CM | POA: Diagnosis not present

## 2017-10-04 NOTE — Telephone Encounter (Signed)
Patient's medical clearance was faxed to his PCP, Dr. Netty Starring at [phone]. Look for a response before 10/19/2017 (his surgery date with Dr. Hampton Abbot).

## 2017-10-04 NOTE — Progress Notes (Signed)
10/04/2017  History of Present Illness: Jimmy Blair is a 76 y.o. male who presents as follow up from visit to ED on 11/12 for incarcerated right inguinal hernia.  He presented with right groin pain and nausea and vomiting, with findings consistent with small bowel obstruction.  I was able to reduce his hernia at bedside and offered admission for semi-elective repair vs outpatient follow-up and he chose outpatient follow-up.    He reports that since his visit to the ED, he has not had any further groin pain, nausea, or vomiting.  Denies any chest pain, shortness of breath, fevers, or chills.  Reports good appetite and normal bowel movements, with last BM yesterday.    Past Medical History: Past Medical History:  Diagnosis Date  . Anemia of chronic disease 11/21/2015  . Chronic rhinitis 10/03/2017  . Essential hypertension 10/03/2017  . History of amaurosis fugax 10/03/2017  . Hypertension   . Incarcerated right inguinal hernia   . Pure hypercholesterolemia 10/03/2017  . Small bowel obstruction ([place])   . Vaccine counseling 07/29/2016     Past Surgical History: --None  Home Medications: Prior to Admission medications   Medication Sig Start Date End Date Taking? Authorizing Provider  aspirin EC 81 MG tablet Take 1 tablet daily by mouth.   Yes [provider]  atorvastatin (LIPITOR) 20 MG tablet Take 1 tablet daily by mouth. 05/09/17  Yes [provider]  lisinopril (PRINIVIL,ZESTRIL) 40 MG tablet Take 1 tablet daily by mouth. 07/13/17  Yes [provider]    Allergies: No Known Allergies   Review of Systems: Review of Systems  Constitutional: Negative for chills and fever.  Respiratory: Negative for shortness of breath.   Cardiovascular: Negative for chest pain.  Gastrointestinal: Negative for abdominal pain, constipation, nausea and vomiting.  Neurological: Negative for dizziness.    Physical Exam BP (!) 141/85   Pulse 97   Temp 98.3 F (36.8 C)  (Oral)   Wt 66.2 kg (146 lb)   BMI 19.80 kg/m  CONSTITUTIONAL: No acute distress HEENT:  Normocephalic, atraumatic, extraocular motion intact. RESPIRATORY:  Lungs are clear, and breath sounds are equal bilaterally. Normal respiratory effort without pathologic use of accessory muscles. CARDIOVASCULAR: Heart is regular without murmurs, gallops, or rubs. GI: The abdomen is soft, non-distended, non-tender to palpation.  Right inguinal hernia present with bowel in hernia sac, extending to upper scrotum, currently reducible without tenderness.  No palpable hernia defect on left groin.  NEUROLOGIC:  Motor and sensation is grossly normal.  Cranial nerves are grossly intact. PSYCH:  Alert and oriented to person, place and time. Affect is normal.  Labs/Imaging: Labs from ED 11/12 --  Na 139, K 3.2, Cl 106, CO2 20, BUN 10, Cr 0.85, Gluc 155, total bilirubin 2.3, AST 26, ALT 16, AP 39.  WBC 6, Hgb 13.6, Hct 41.7, Plt 243.  CT scan 11/12 -- IMPRESSION: 1. Dilated right lower quadrant small bowel with twisting of the right lower quadrant mesenteric vascularity is concerning for a closed loop small bowel obstruction. No pneumatosis or free intraperitoneal air. 2. Dilated small bowel within the right inguinal canal extends into the scrotum and is worrisome for incarcerated inguinal hernia. 3.  Aortic Atherosclerosis (ICD10-I70.0). 4. Intermediate attenuation left adrenal lesion. This could be further characterized with multiphase adrenal protocol CT or abdominal MRI on a nonemergent basis.  Assessment and Plan: This is a 76 y.o. male who presents for follow up for what started as an incarcerated right inguinal hernia, reduced at  bedside.  I have independently viewed the patient's imaging study and reviewed his laboratory studies.  His CT scan did show findings for small bowel obstruction with dilated loops and bowel in right hernia sac, but this was reduced at bedside and patient has not had any symptoms  since his ED visit.  Discussed with the patient that given the significant complication from his right inguinal hernia, causing a small bowel obstruction, I would recommend surgical repair of his right inguinal hernia.  Discussed with him and his daughter about an open right inguinal hernia repair with mesh.  Discussed risks of bleeding, infection, and injury to surrounding structures, as well as post-op outcomes and restrictions and recovery time.  This would be an outpatient procedure.  We will send form to his PCP for surgical clearance and tentatively schedule him for 10/19/17.  Discussed with him that he would have to stop his Aspirin 3 days prior to surgery.  Patient understands this plan and all of his questions have been answered.   Melvyn Neth, [place]

## 2017-10-04 NOTE — Patient Instructions (Signed)
Please look at your blue sheet in case you have any questions about your surgery.

## 2017-10-05 ENCOUNTER — Telehealth: Payer: Self-pay | Admitting: Surgery

## 2017-10-05 NOTE — Telephone Encounter (Signed)
Jimmy Blair has called back and all information has been given.

## 2017-10-05 NOTE — Telephone Encounter (Signed)
I have called Jimmy Blair's Daughter to go over surgery information. No answer. I have left a message for [name] to call back.    pre op date/time and sx date. Sx: 10/19/17 with Dr Orest Dikes open inguinal hernia repair possible mesh.  Pre op: 10/10/17 between 9-1:00pm--phone interview.  Patient made aware to call [phone], between 1-3:00pm the day before surgery, to find out what time to arrive.

## 2017-10-07 NOTE — Telephone Encounter (Signed)
Called Dr. Raylene Miyamoto office to ask about patient's medical clearance and I was told that the patient needs to be seen by the provider before he could since off. They also stated that they had tried contacting the patient and the two daughters and have not had luck. They stated that they will send him a letter to contact them but they don't guarantee that the doctor will sign it before 10/19/2017. I told them that we will try to reach them too so they could call his office to schedule an appointment so we could get clearance.

## 2017-10-07 NOTE — Telephone Encounter (Signed)
Contacted patient's daughter, [name] and told her that Dr. Netty Starring wanted to see her father before he could give him medical clearance. [name] stated that she would call Dr. Reeves Forth office today to schedule his appointment for next Thursday hoping that he gets medical clearance. I told her that I would be checking for his clearance if obtained. [name] understood and had no further questions.

## 2017-10-10 ENCOUNTER — Other Ambulatory Visit: Payer: Self-pay

## 2017-10-10 ENCOUNTER — Encounter
Admission: RE | Admit: 2017-10-10 | Discharge: 2017-10-10 | Disposition: A | Discharge status: Home / Self Care | Payer: Medicare Other | Source: Ambulatory Visit | Attending: Surgery | Admitting: Surgery

## 2017-10-10 HISTORY — DX: Cerebral infarction, unspecified: I63.9

## 2017-10-10 NOTE — Patient Instructions (Addendum)
  Your procedure is scheduled on: 10-19-17 [facility] Report to Same Day Surgery 2nd floor medical mall [facility] Entrance-take elevator on left to 2nd floor.  Check in with surgery information desk.) To find out your arrival time please call [phone] between 1PM - 3PM on 10-18-17 TUESDAY  Remember: Instructions that are not followed completely may result in serious medical risk, up to and including death, or upon the discretion of your surgeon and anesthesiologist your surgery may need to be rescheduled.    _x___ 1. Do not eat food after midnight the night before your procedure. NO GUM OR CANDY AFTER MIDNIGHT.  You may drink clear liquids up to 2 hours before you are scheduled to arrive at the hospital for your procedure.  Do not drink clear liquids within 2 hours of your scheduled arrival to the hospital.  Clear liquids include  --Water or Apple juice without pulp  --Clear carbohydrate beverage such as ClearFast or Gatorade  --Black Coffee or Clear Tea (No milk, no creamers, do not add anything to the coffee or Tea     __x__ 2. No Alcohol for 24 hours before or after surgery.   __x__3. No Smoking for 24 prior to surgery.   ____  4. Bring all medications with you on the day of surgery if instructed.    __x__ 5. Notify your doctor if there is any change in your medical condition     (cold, fever, infections).     Do not wear jewelry, make-up, hairpins, clips or nail polish.  Do not wear lotions, powders, or perfumes. You may wear deodorant.  Do not shave 48 hours prior to surgery. Men may shave face and neck.  Do not bring valuables to the hospital.    [facility] is not responsible for any belongings or valuables.               Contacts, dentures or bridgework may not be worn into surgery.  Leave your suitcase in the car. After surgery it may be brought to your room.  For patients admitted to the hospital, discharge time is determined by your treatment team.   Patients  discharged the day of surgery will not be allowed to drive home.  You will need someone to drive you home and stay with you the night of your procedure.    Please read over the following fact sheets that you were given:   [facility] Preparing for Surgery and or MRSA Information   _x___ TAKE THE FOLLOWING MEDICATION THE MORNING OF SURGERY WITH A SMALL SIP OF WATER. These include:  1. ATORVASTATIN (LIPITOR)  2.   3.  4.  5.  6.  ____Fleets enema or Magnesium Citrate as directed.   _x___ Use CHG Soap or sage wipes as directed on instruction sheet   ____ Use inhalers on the day of surgery and bring to hospital day of surgery  ____ Stop Metformin and Janumet 2 days prior to surgery.    ____ Take 1/2 of usual insulin dose the night before surgery and none on the morning surgery.   _x___ Follow recommendations from Cardiologist, Pulmonologist or PCP regarding stopping Aspirin, Coumadin, Plavix ,Eliquis, Effient, or Pradaxa, and Pletal-ASK DR LINTHAVONG ABOUT STOPPING ASPIRIN  X____Stop Anti-inflammatories such as Advil, Aleve, Ibuprofen, Motrin, Naproxen, Naprosyn, Goodies powders or aspirin products NOW-OK to take Tylenol    ____ Stop supplements until after surgery.   ____ Bring C-Pap to the hospital.

## 2017-10-13 ENCOUNTER — Emergency Department (EMERGENCY_DEPARTMENT_HOSPITAL)
Admission: EM | Admit: 2017-10-13 | Discharge: 2017-10-13 | Disposition: A | Discharge status: Home / Self Care | Payer: Medicare Other | Source: Home / Self Care | Attending: Emergency Medicine | Admitting: Emergency Medicine

## 2017-10-13 ENCOUNTER — Encounter
Admission: RE | Admit: 2017-10-13 | Discharge: 2017-10-13 | Disposition: A | Discharge status: Home / Self Care | Payer: Medicare Other | Source: Ambulatory Visit | Attending: Surgery | Admitting: Surgery

## 2017-10-13 ENCOUNTER — Other Ambulatory Visit: Payer: Self-pay

## 2017-10-13 DIAGNOSIS — K4030 Unilateral inguinal hernia, with obstruction, without gangrene, not specified as recurrent: Secondary | ICD-10-CM | POA: Diagnosis not present

## 2017-10-13 DIAGNOSIS — Z7982 Long term (current) use of aspirin: Secondary | ICD-10-CM | POA: Insufficient documentation

## 2017-10-13 DIAGNOSIS — I10 Essential (primary) hypertension: Secondary | ICD-10-CM | POA: Insufficient documentation

## 2017-10-13 DIAGNOSIS — Z79899 Other long term (current) drug therapy: Secondary | ICD-10-CM | POA: Insufficient documentation

## 2017-10-13 DIAGNOSIS — Z8673 Personal history of transient ischemic attack (TIA), and cerebral infarction without residual deficits: Secondary | ICD-10-CM

## 2017-10-13 DIAGNOSIS — K4031 Unilateral inguinal hernia, with obstruction, without gangrene, recurrent: Secondary | ICD-10-CM | POA: Insufficient documentation

## 2017-10-13 LAB — CBC
HCT: 40.5 % (ref 40.0–52.0)
Hemoglobin: 13.3 g/dL (ref 13.0–18.0)
MCH: 29.9 pg (ref 26.0–34.0)
MCHC: 32.9 g/dL (ref 32.0–36.0)
MCV: 90.7 fL (ref 80.0–100.0)
Platelets: 230 10*3/uL (ref 150–440)
RBC: 4.46 MIL/uL (ref 4.40–5.90)
RDW: 13.9 % (ref 11.5–14.5)
WBC: 4.6 10*3/uL (ref 3.8–10.6)

## 2017-10-13 LAB — COMPREHENSIVE METABOLIC PANEL
ALBUMIN: 4.1 g/dL (ref 3.5–5.0)
ALK PHOS: 45 U/L (ref 38–126)
ALT: 16 U/L — ABNORMAL LOW (ref 17–63)
ANION GAP: 10 (ref 5–15)
AST: 23 U/L (ref 15–41)
BILIRUBIN TOTAL: 1.5 mg/dL — AB (ref 0.3–1.2)
BUN: 10 mg/dL (ref 6–20)
CALCIUM: 9.2 mg/dL (ref 8.9–10.3)
CO2: 23 mmol/L (ref 22–32)
Chloride: 105 mmol/L (ref 101–111)
Creatinine, Ser: 0.78 mg/dL (ref 0.61–1.24)
GFR calc Af Amer: >60 mL/min (ref >60)
GLUCOSE: 160 mg/dL — AB (ref 65–99)
Potassium: 3.8 mmol/L (ref 3.5–5.1)
Sodium: 138 mmol/L (ref 135–145)
TOTAL PROTEIN: 7.1 g/dL (ref 6.5–8.1)

## 2017-10-13 LAB — LIPASE, BLOOD: Lipase: 25 U/L (ref 11–51)

## 2017-10-13 MED ORDER — SODIUM CHLORIDE 0.9 % IV BOLUS (SEPSIS)
1000.0000 mL | Freq: Once | INTRAVENOUS | Status: AC
  Administered 2017-10-13: 1000 mL via INTRAVENOUS

## 2017-10-13 MED ORDER — ONDANSETRON HCL 4 MG/2ML IJ SOLN
4.0000 mg | Freq: Once | INTRAMUSCULAR | Status: AC | PRN
  Administered 2017-10-13: 4 mg via INTRAVENOUS
  Filled 2017-10-13: qty 2

## 2017-10-13 MED ORDER — HYDROMORPHONE HCL 1 MG/ML IJ SOLN
0.5000 mg | Freq: Once | INTRAMUSCULAR | Status: AC
  Administered 2017-10-13: 0.5 mg via INTRAVENOUS
  Filled 2017-10-13: qty 1

## 2017-10-13 MED ORDER — MORPHINE SULFATE (PF) 4 MG/ML IV SOLN
4.0000 mg | Freq: Once | INTRAVENOUS | Status: AC
  Administered 2017-10-13: 4 mg via INTRAVENOUS
  Filled 2017-10-13: qty 1

## 2017-10-13 MED ORDER — FENTANYL CITRATE (PF) 100 MCG/2ML IJ SOLN
50.0000 ug | INTRAMUSCULAR | Status: DC | PRN
  Administered 2017-10-13: 50 ug via INTRAVENOUS
  Filled 2017-10-13: qty 2

## 2017-10-13 MED ORDER — ONDANSETRON HCL 4 MG/2ML IJ SOLN
4.0000 mg | Freq: Once | INTRAMUSCULAR | Status: AC
  Administered 2017-10-13: 4 mg via INTRAVENOUS
  Filled 2017-10-13: qty 2

## 2017-10-13 NOTE — ED Notes (Signed)
MD Cooper at bedside  

## 2017-10-13 NOTE — ED Notes (Signed)
Pt verbalizes d/c teaching and follow up as well as daughter. Daughter has signed for pt per preference. Pt in wheelchair to lobby while daughter gets car, [employer] officer aware and will help pt to vehicle. Pt Vs stable, NAD noted

## 2017-10-13 NOTE — ED Notes (Signed)
Ice pack applied to hernia at this time

## 2017-10-13 NOTE — Consult Note (Signed)
Surgical Consultation  10/13/2017  Jimmy Blair is an 76 y.o. male.   Referring Physician: Clearnce Hasten  CC: Incarcerated right inguinal hernia  HPI: This patient with a known history of a writing or hernia.  It has been incarcerated in the past.  In fact Dr. Hampton Abbot had reduced it in the emergency room on 4 December, seen him in the office, and scheduled surgery for next week,.  He was at preadmission testing today and started having pain and nausea.  He is thrown up multiple times.  I was asked see the patient for an incarcerated right anal hernia that the emergency room physician was unable to reduce.  History is reviewed with the patient's daughter and in the chart as well.  Past Medical History:  Diagnosis Date  . Anemia of chronic disease 11/21/2015  . Chronic rhinitis 10/03/2017  . Essential hypertension 10/03/2017  . History of amaurosis fugax 10/03/2017  . Hypertension   . Incarcerated right inguinal hernia   . Pure hypercholesterolemia 10/03/2017  . Small bowel obstruction ([place])   . 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 Vaccine counseling 07/29/2016    Past Surgical History:  Procedure Laterality Date  . NO PAST SURGERIES      No family history on file.  Social History:  reports that  has never smoked. he has never used smokeless tobacco. He reports that he drinks alcohol. He reports that he does not use drugs.  Allergies: No Known Allergies  Medications reviewed.   Review of Systems:   Review of Systems  Unable to perform ROS: Severity of pain    It was difficult in obtaining a review of systems.  Most of the symptomatology was derived from the nursing staff as well as from the patient's daughter.  Physical Exam:  BP 139/72   Pulse 73   Resp 16   Ht '5\' 10"'$  (1.778 m)   Wt 147 lb (66.7 kg)   SpO2 100%   BMI 21.09 kg/m   Physical Exam  Constitutional: He is well-developed, well-nourished, and in no distress. No distress.  Thin male   HENT:  Head: Normocephalic and atraumatic.  Abdominal: Soft. He exhibits no distension. There is no tenderness. There is no rebound and no guarding.  Genitourinary: Penis normal.  Genitourinary Comments: Right groin with large scrotal hernia.  It is tense but nonerythematous.  Minimally tender.  The right groin incarceration was reduced without much difficulty but required constant pressure and reducing with massage.  Ultimately the groin hernia was completely reduced.  Testicle was normal abdomen was nontender  Musculoskeletal: Normal range of motion. He exhibits no edema.  Skin: Skin is warm and dry. He is not diaphoretic. No erythema.  Vitals reviewed.     Results for orders placed or performed during the hospital encounter of 10/13/17 (from the past 48 hour(s))  Lipase, blood     Status: None   Collection Time: 10/13/17 10:41 AM  Result Value Ref Range   Lipase 25 11 - 51 U/L  Comprehensive metabolic panel     Status: Abnormal   Collection Time: 10/13/17 10:41 AM  Result Value Ref Range   Sodium 138 135 - 145 mmol/L   Potassium 3.8 3.5 - 5.1 mmol/L   Chloride 105 101 - 111 mmol/L   CO2 23 22 - 32 mmol/L   Glucose, Bld 160 (H) 65 - 99 mg/dL   BUN 10 6 - 20 mg/dL   Creatinine, Ser 0.78  0.61 - 1.24 mg/dL   Calcium 9.2 8.9 - 10.3 mg/dL   Total Protein 7.1 6.5 - 8.1 g/dL   Albumin 4.1 3.5 - 5.0 g/dL   AST 23 15 - 41 U/L   ALT 16 (L) 17 - 63 U/L   Alkaline Phosphatase 45 38 - 126 U/L   Total Bilirubin 1.5 (H) 0.3 - 1.2 mg/dL   GFR calc non Af Amer >60 >60 mL/min   GFR calc Af Amer >60 >60 mL/min    Comment: (NOTE) The eGFR has been calculated using the CKD EPI equation. This calculation has not been validated in all clinical situations. eGFR's persistently <60 mL/min signify possible Chronic Kidney Disease.    Anion gap 10 5 - 15  CBC     Status: None   Collection Time: 10/13/17 10:41 AM  Result Value Ref Range   WBC 4.6 3.8 - 10.6 K/uL   RBC 4.46 4.40 - 5.90 MIL/uL    Hemoglobin 13.3 13.0 - 18.0 g/dL   HCT 40.5 40.0 - 52.0 %   MCV 90.7 80.0 - 100.0 fL   MCH 29.9 26.0 - 34.0 pg   MCHC 32.9 32.0 - 36.0 g/dL   RDW 13.9 11.5 - 14.5 %   Platelets 230 150 - 440 K/uL   No results found.  Assessment/Plan:  This patient with a known right inguinal hernia which is scrotal in nature it has been incarcerated in the past.  Dr. Hampton Abbot reduced it on 4 December and I was able to reduce it today.  I asked the patient's daughter to obtain a truss in order to get him through to his scheduled operation on Wednesday.  If it recurs he would require an emergency operation that it might not allow [place] to utilize mesh without a high risk of infection.  They understood and agreed with this plan.  I discussed this with the emergency room physician.  Florene Glen, MD, FACS

## 2017-10-13 NOTE — ED Triage Notes (Signed)
Pt is here with his daughter from pre op for right hernia repair and states over the last hour he has had increased pain from 1-9 with N/V.Jimmy Blair

## 2017-10-13 NOTE — Pre-Procedure Instructions (Addendum)
Pt came to PAT today for EKG and labs for upcoming [id number] repair.  Upon arrival, pts daughter states that he is in a lot of pain and this has not happened since he was diagnosed with a hernia 2 weeks ago.  Pts daughter went up and spoke with surgeons office while I was talking with pt and they want to see pt as soon as we are done with his instructions, labs and EKG.  Pt rating pain 8/10.  While going over instructions with pts daughter for surgery, pt began throwing up bile colored emesis. I called Dr Abelardo Diesel office and they want him to be seen in ED. I called Jacques Navy in ED and gave her this information.  Pt taken to ED via wheelchair with daughter following. BP 117/73 P 69 O2 sat 100% on RA

## 2017-10-13 NOTE — ED Provider Notes (Addendum)
[facility] Emergency Department Provider Note  ____________________________________________   None    (approximate)  I have reviewed the triage vital signs and the nursing notes.   HISTORY  Chief Complaint Inguinal Hernia   HPI Jimmy Blair is a 76 y.o. male with a history of a right lower quadrant inguinal hernia who is presenting to the emergency department with sudden onset right inguinal hernia pain that started about 2 hours ago when the patient was in preadmission to have testing done for his surgery for the hernia which is scheduled next week.  The patient's pain is a 9 out of 10 and constant.  He has had multiple episodes of nausea and vomiting since the pain began.  He was sent to the emergency department from preadmission after his condition worsened.   Past Medical History:  Diagnosis Date  . Anemia of chronic disease 11/21/2015  . Chronic rhinitis 10/03/2017  . Essential hypertension 10/03/2017  . History of amaurosis fugax 10/03/2017  . Hypertension   . Incarcerated right inguinal hernia   . Pure hypercholesterolemia 10/03/2017  . Small bowel obstruction ([place])   . 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 Vaccine counseling 07/29/2016    Patient Active Problem List   Diagnosis Date Noted  . Non-recurrent unilateral inguinal hernia with obstruction without gangrene   . Chronic rhinitis 10/03/2017  . Essential hypertension 10/03/2017  . History of amaurosis fugax 10/03/2017  . Pure hypercholesterolemia 10/03/2017  . Incarcerated right inguinal hernia   . Small bowel obstruction ([place])   . Vaccine counseling 07/29/2016  . Anemia of chronic disease 11/21/2015    Past Surgical History:  Procedure Laterality Date  . NO PAST SURGERIES      Prior to Admission medications   Medication Sig Start Date End Date Taking? Authorizing Provider  aspirin EC 81 MG tablet Take 1 tablet daily by mouth.    [provider]    atorvastatin (LIPITOR) 20 MG tablet Take 1 tablet by mouth every morning.  05/09/17   [provider]  lisinopril (PRINIVIL,ZESTRIL) 40 MG tablet Take 1 tablet by mouth every morning.  07/13/17   [provider]    Allergies Patient has no known allergies.  No family history on file.  Social History Social History   Tobacco Use  . Smoking status: Never Smoker  . Smokeless tobacco: Never Used  Substance Use Topics  . Alcohol use: Yes    Frequency: Never    Comment: 2-3beer daily  . Drug use: No    Review of Systems  Constitutional: No fever/chills Eyes: No visual changes. ENT: No sore throat. Cardiovascular: Denies chest pain. Respiratory: Denies shortness of breath. Gastrointestinal: No diarrhea.  No constipation. Genitourinary: Negative for dysuria. Musculoskeletal: Negative for back pain. Skin: Negative for rash. Neurological: Negative for headaches, focal weakness or numbness.   ____________________________________________   PHYSICAL EXAM:  VITAL SIGNS: ED Triage Vitals [10/13/17 1037]  Enc Vitals Group     BP 105/86     Pulse Rate 69     Resp 16     Temp      Temp Source Oral     SpO2 100 %     Weight 147 lb (66.7 kg)     Height 5\' 10"  (1.778 m)     Head Circumference      Peak Flow      Pain Score 9     Pain Loc  Pain Edu?      Excl. in [place]?     Constitutional: Alert and oriented.  Patient appears uncomfortable. Eyes: Conjunctivae are normal.  Head: Atraumatic. Nose: No congestion/rhinnorhea. Mouth/Throat: Mucous membranes are moist.  Neck: No stridor.   Cardiovascular: Normal rate, regular rhythm. Grossly normal heart sounds. Respiratory: Normal respiratory effort.  No retractions. Lungs CTAB. Gastrointestinal: Soft with mild and diffuse ttp. No distention. No CVA tenderness. Genitourinary: Patient with large right inguinal hernia which is tense and very tender to palpation.  Attempted to place the patient in Trendelenburg  and reduced with gentle pressure but I was unsuccessful.  The patient says that he is in a lot of pain during this attempt. Musculoskeletal: No lower extremity tenderness nor edema.  No joint effusions. Neurologic:  Normal speech and language. No gross focal neurologic deficits are appreciated. Skin:  Skin is warm, dry and intact. No rash noted. Psychiatric: Mood and affect are normal. Speech and behavior are normal.  ____________________________________________   LABS (all labs ordered are listed, but only abnormal results are displayed)  Labs Reviewed  COMPREHENSIVE METABOLIC PANEL - Abnormal; Notable for the following components:      Result Value   Glucose, Bld 160 (*)    ALT 16 (*)    Total Bilirubin 1.5 (*)    All other components within normal limits  LIPASE, BLOOD  CBC  URINALYSIS, COMPLETE (UACMP) WITH MICROSCOPIC   ____________________________________________  EKG   ____________________________________________  RADIOLOGY   ____________________________________________   PROCEDURES  Procedure(s) performed:   Procedures  Critical Care performed:   ____________________________________________   INITIAL IMPRESSION / ASSESSMENT AND PLAN / ED COURSE  Pertinent labs & imaging results that were available during my care of the patient were reviewed by me and considered in my medical decision making (see chart for details).  DDX: Bowel obstruction, nausea, vomiting, incarcerated hernia, strangulate hernia As part of my medical decision making, I reviewed the following data within the [place] Notes from prior ED visits  ----------------------------------------- 11:56 AM on 10/13/2017 -----------------------------------------  Call placed to Dr. Burt Knack of surgery who says that he will be down to see the patient but will take about an hour.  In the meantime, the patient will be given pain meds as well as nausea meds and will place an ice pack  over the hernia and attempt reduction a second time.  Plan told to the patient and daughter who is at the bedside.  They are understanding and willing to comply.   ----------------------------------------- 1:40 PM on 10/13/2017 -----------------------------------------  Patient evaluated by Dr. Burt Knack of surgery who was able to reduce the hernia.  Patient without any pain at this time.  Dr. Burt Knack recommends a truss brace as well as follow-up this coming week for his scheduled hernia surgery.  The family as well as the patient understanding of this plan willing to comply people be discharged home at this time.     ____________________________________________   FINAL CLINICAL IMPRESSION(S) / ED DIAGNOSES  Hernia, reduced    NEW MEDICATIONS STARTED DURING THIS VISIT:  This SmartLink is deprecated. Use AVSMEDLIST instead to display the medication list for a patient.   Note:  This document was prepared using Dragon voice recognition software and may include unintentional dictation errors.     Orbie Pyo, MD 10/13/17 1156    Jinny Sweetland, Randall An, MD 10/13/17 1340

## 2017-10-13 NOTE — ED Notes (Signed)
First  nurse note  Brought down for Pre-admit testing   Having severe abd pain with n/v  Hx of hernia  Was here to get ready for hernia surgery

## 2017-10-13 NOTE — Telephone Encounter (Signed)
Medical clearance was provided from Dr. Netty Starring. This will be faxed to Pre-Admit.

## 2017-10-16 ENCOUNTER — Other Ambulatory Visit: Payer: Self-pay

## 2017-10-16 ENCOUNTER — Inpatient Hospital Stay
Admission: EM | Admit: 2017-10-16 | Discharge: 2017-10-17 | DRG: 352 | Disposition: A | Discharge status: Home / Self Care | Payer: Medicare Other | Attending: Surgery | Admitting: Surgery

## 2017-10-16 DIAGNOSIS — D176 Benign lipomatous neoplasm of spermatic cord: Secondary | ICD-10-CM | POA: Diagnosis present

## 2017-10-16 DIAGNOSIS — D638 Anemia in other chronic diseases classified elsewhere: Secondary | ICD-10-CM | POA: Diagnosis present

## 2017-10-16 DIAGNOSIS — Z7982 Long term (current) use of aspirin: Secondary | ICD-10-CM

## 2017-10-16 DIAGNOSIS — Z23 Encounter for immunization: Secondary | ICD-10-CM

## 2017-10-16 DIAGNOSIS — K4030 Unilateral inguinal hernia, with obstruction, without gangrene, not specified as recurrent: Principal | ICD-10-CM | POA: Diagnosis present

## 2017-10-16 DIAGNOSIS — Z79899 Other long term (current) drug therapy: Secondary | ICD-10-CM | POA: Diagnosis not present

## 2017-10-16 DIAGNOSIS — I10 Essential (primary) hypertension: Secondary | ICD-10-CM | POA: Diagnosis present

## 2017-10-16 DIAGNOSIS — Z8673 Personal history of transient ischemic attack (TIA), and cerebral infarction without residual deficits: Secondary | ICD-10-CM | POA: Diagnosis not present

## 2017-10-16 DIAGNOSIS — E7800 Pure hypercholesterolemia, unspecified: Secondary | ICD-10-CM | POA: Diagnosis present

## 2017-10-16 LAB — COMPREHENSIVE METABOLIC PANEL
ALK PHOS: 42 U/L (ref 38–126)
ALT: 14 U/L — AB (ref 17–63)
AST: 28 U/L (ref 15–41)
Albumin: 3.8 g/dL (ref 3.5–5.0)
Anion gap: 11 (ref 5–15)
BILIRUBIN TOTAL: 1.7 mg/dL — AB (ref 0.3–1.2)
BUN: 11 mg/dL (ref 6–20)
CALCIUM: 9 mg/dL (ref 8.9–10.3)
CO2: 23 mmol/L (ref 22–32)
CREATININE: 0.83 mg/dL (ref 0.61–1.24)
Chloride: 106 mmol/L (ref 101–111)
Glucose, Bld: 154 mg/dL — ABNORMAL HIGH (ref 65–99)
Potassium: 3.4 mmol/L — ABNORMAL LOW (ref 3.5–5.1)
Sodium: 140 mmol/L (ref 135–145)
TOTAL PROTEIN: 6.6 g/dL (ref 6.5–8.1)

## 2017-10-16 LAB — CBC WITH DIFFERENTIAL/PLATELET
Basophils Absolute: 0 10*3/uL (ref 0–0.1)
Basophils Relative: 0 %
EOS PCT: 2 %
Eosinophils Absolute: 0.1 10*3/uL (ref 0–0.7)
HCT: 38 % — ABNORMAL LOW (ref 40.0–52.0)
Hemoglobin: 12.8 g/dL — ABNORMAL LOW (ref 13.0–18.0)
LYMPHS ABS: 1.4 10*3/uL (ref 1.0–3.6)
LYMPHS PCT: 25 %
MCH: 30.4 pg (ref 26.0–34.0)
MCHC: 33.8 g/dL (ref 32.0–36.0)
MCV: 90.2 fL (ref 80.0–100.0)
Monocytes Absolute: 0.4 10*3/uL (ref 0.2–1.0)
Monocytes Relative: 8 %
Neutro Abs: 3.6 10*3/uL (ref 1.4–6.5)
Neutrophils Relative %: 65 %
PLATELETS: 208 10*3/uL (ref 150–440)
RBC: 4.22 MIL/uL — AB (ref 4.40–5.90)
RDW: 13.8 % (ref 11.5–14.5)
WBC: 5.6 10*3/uL (ref 3.8–10.6)

## 2017-10-16 LAB — SURGICAL PCR SCREEN
MRSA, PCR: NEGATIVE
Staphylococcus aureus: NEGATIVE

## 2017-10-16 MED ORDER — SODIUM CHLORIDE 0.9 % IV SOLN: 250.0000 mL | INTRAVENOUS | Status: DC | PRN

## 2017-10-16 MED ORDER — HYDROMORPHONE HCL 1 MG/ML IJ SOLN
0.5000 mg | Freq: Once | INTRAMUSCULAR | Status: AC
  Administered 2017-10-16: 0.5 mg via INTRAVENOUS

## 2017-10-16 MED ORDER — HYDROMORPHONE HCL 1 MG/ML IJ SOLN
INTRAMUSCULAR | Status: AC
  Administered 2017-10-16: 0.5 mg via INTRAVENOUS
  Filled 2017-10-16: qty 1

## 2017-10-16 MED ORDER — MORPHINE SULFATE (PF) 4 MG/ML IV SOLN
4.0000 mg | Freq: Once | INTRAVENOUS | Status: AC
  Administered 2017-10-16: 4 mg via INTRAVENOUS
  Filled 2017-10-16: qty 1

## 2017-10-16 MED ORDER — ONDANSETRON HCL 4 MG/2ML IJ SOLN
4.0000 mg | Freq: Once | INTRAMUSCULAR | Status: AC
  Administered 2017-10-16: 4 mg via INTRAVENOUS
  Filled 2017-10-16: qty 2

## 2017-10-16 MED ORDER — ONDANSETRON HCL 4 MG/2ML IJ SOLN
4.0000 mg | Freq: Four times a day (QID) | INTRAMUSCULAR | Status: DC | PRN
  Administered 2017-10-17: 4 mg via INTRAVENOUS

## 2017-10-16 MED ORDER — HEPARIN SODIUM (PORCINE) 5000 UNIT/ML IJ SOLN
5000.0000 [IU] | Freq: Three times a day (TID) | INTRAMUSCULAR | Status: DC
  Administered 2017-10-16 – 2017-10-17 (×3): 5000 [IU] via SUBCUTANEOUS
  Filled 2017-10-16 (×3): qty 1

## 2017-10-16 MED ORDER — MORPHINE SULFATE (PF) 2 MG/ML IV SOLN: 2.0000 mg | INTRAVENOUS | Status: DC | PRN

## 2017-10-16 MED ORDER — HYDROCODONE-ACETAMINOPHEN 5-325 MG PO TABS: 1.0000 | ORAL_TABLET | ORAL | Status: DC | PRN

## 2017-10-16 MED ORDER — CEFAZOLIN SODIUM-DEXTROSE 2-4 GM/100ML-% IV SOLN: 2.0000 g | Freq: Once | INTRAVENOUS | Status: DC

## 2017-10-16 MED ORDER — INFLUENZA VAC SPLIT HIGH-DOSE 0.5 ML IM SUSY
0.5000 mL | PREFILLED_SYRINGE | INTRAMUSCULAR | Status: AC
  Administered 2017-10-17: 0.5 mL via INTRAMUSCULAR
  Filled 2017-10-16 (×2): qty 0.5

## 2017-10-16 MED ORDER — LISINOPRIL 20 MG PO TABS
40.0000 mg | ORAL_TABLET | ORAL | Status: DC
  Administered 2017-10-17: 40 mg via ORAL
  Filled 2017-10-16: qty 2

## 2017-10-16 MED ORDER — ATORVASTATIN CALCIUM 20 MG PO TABS
20.0000 mg | ORAL_TABLET | ORAL | Status: DC
  Administered 2017-10-17: 20 mg via ORAL
  Filled 2017-10-16: qty 1

## 2017-10-16 MED ORDER — SODIUM CHLORIDE 0.9% FLUSH: 3.0000 mL | INTRAVENOUS | Status: DC | PRN

## 2017-10-16 MED ORDER — HYDROMORPHONE HCL 1 MG/ML IJ SOLN
INTRAMUSCULAR | Status: AC
  Filled 2017-10-16: qty 1

## 2017-10-16 MED ORDER — ONDANSETRON HCL 4 MG PO TABS: 4.0000 mg | ORAL_TABLET | Freq: Four times a day (QID) | ORAL | Status: DC | PRN

## 2017-10-16 MED ORDER — SODIUM CHLORIDE 0.9 % IV BOLUS (SEPSIS)
1000.0000 mL | Freq: Once | INTRAVENOUS | Status: AC
  Administered 2017-10-16: 1000 mL via INTRAVENOUS

## 2017-10-16 MED ORDER — SODIUM CHLORIDE 0.9% FLUSH
3.0000 mL | Freq: Two times a day (BID) | INTRAVENOUS | Status: DC
  Administered 2017-10-16 – 2017-10-17 (×3): 3 mL via INTRAVENOUS

## 2017-10-16 NOTE — ED Triage Notes (Signed)
EMS reports right lower groin hernia, previously Dx by provider. Previous occasion hernia was "popped" back into place. Patient stated provider stated hernia requires surgery. Reports pain 10/10

## 2017-10-16 NOTE — ED Notes (Signed)
Patient states he has not had anything to eat or drink since evening of 10/15/17

## 2017-10-16 NOTE — H&P (Signed)
Jimmy Blair is an 76 y.o. male.    Chief Complaint: Right groin pain  HPI: This patient with a known right inguinal hernia.  It is scrotal in nature and has been incarcerated multiple times.  Most recently Dr. Hampton Blair we had reduced and incarcerated right inguinal hernia on 4 December.  Last week I was personally able to reduce his right incarcerated inguinal hernia in the emergency room and he was discharged with instructions to utilize a truss.  Today he returns wearing the truss but it is up around his upper groin and not over an incarcerated right inguinal hernia.  I was able to reduce it again in the ER today.  He has had no nausea or vomiting but has had pain with the incarceration. Dr. Hampton Blair has scheduled the patient for surgery on Wednesday of this week.  With his third return to the ER chose to manually reduce it successfully and admit the patient to the hospital for repair by Dr. Hampton Blair tomorrow if possible.  Past Medical History:  Diagnosis Date  . Anemia of chronic disease 11/21/2015  . Chronic rhinitis 10/03/2017  . Essential hypertension 10/03/2017  . History of amaurosis fugax 10/03/2017  . Hypertension   . Incarcerated right inguinal hernia   . Pure hypercholesterolemia 10/03/2017  . Small bowel obstruction ([place])   . 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 Vaccine counseling 07/29/2016    Past Surgical History:  Procedure Laterality Date  . NO PAST SURGERIES      History reviewed. No pertinent family history. Social History:  reports that  has never smoked. he has never used smokeless tobacco. He reports that he drinks alcohol. He reports that he does not use drugs.  Allergies: No Known Allergies   (Not in a hospital admission)   Review of Systems  Unable to perform ROS: Acuity of condition   Review of systems could not be completed but much of the history came from his daughters.  Physical Exam:  BP (!) 151/83 (BP Location: Left Arm)    Pulse 74   Temp 98.1 F (36.7 C) (Oral)   Resp 18   Ht '5\' 10"'$  (1.778 m)   Wt 139 lb 3.2 oz (63.1 kg)   SpO2 100%   BMI 19.97 kg/m   Physical Exam  Constitutional: He is oriented to person, place, and time and well-developed, well-nourished, and in no distress. No distress.  Elderly thin male in no acute distress  HENT:  Head: Normocephalic and atraumatic.  Eyes: Pupils are equal, round, and reactive to light. Right eye exhibits no discharge. Left eye exhibits no discharge. No scleral icterus.  Neck: Normal range of motion.  Cardiovascular: Normal rate, regular rhythm and normal heart sounds.  Pulmonary/Chest: Effort normal and breath sounds normal. No respiratory distress. He has no wheezes. He has no rales.  Abdominal: Soft. He exhibits no distension. There is no tenderness. There is no rebound and no guarding.  Genitourinary: Penis normal.  Genitourinary Comments: Large scrotal hernia on the right which is nontender but incarcerated.  There is no overlying erythema.  With very little difficulty it was reduced in the emergency room.  This was done with manual compression only.  Musculoskeletal: Normal range of motion. He exhibits no edema or tenderness.  Lymphadenopathy:    He has no cervical adenopathy.  Neurological: He is alert and oriented to person, place, and time.  Skin: Skin is warm and dry. No rash noted. He  is not diaphoretic. No erythema.  Psychiatric: Mood and affect normal.  Vitals reviewed.       Results for orders placed or performed during the hospital encounter of 10/16/17 (from the past 48 hour(s))  CBC with Differential     Status: Abnormal   Collection Time: 10/16/17 10:24 AM  Result Value Ref Range   WBC 5.6 3.8 - 10.6 K/uL   RBC 4.22 (L) 4.40 - 5.90 MIL/uL   Hemoglobin 12.8 (L) 13.0 - 18.0 g/dL   HCT 38.0 (L) 40.0 - 52.0 %   MCV 90.2 80.0 - 100.0 fL   MCH 30.4 26.0 - 34.0 pg   MCHC 33.8 32.0 - 36.0 g/dL   RDW 13.8 11.5 - 14.5 %   Platelets 208  150 - 440 K/uL   Neutrophils Relative % 65 %   Neutro Abs 3.6 1.4 - 6.5 K/uL   Lymphocytes Relative 25 %   Lymphs Abs 1.4 1.0 - 3.6 K/uL   Monocytes Relative 8 %   Monocytes Absolute 0.4 0.2 - 1.0 K/uL   Eosinophils Relative 2 %   Eosinophils Absolute 0.1 0 - 0.7 K/uL   Basophils Relative 0 %   Basophils Absolute 0.0 0 - 0.1 K/uL  Comprehensive metabolic panel     Status: Abnormal   Collection Time: 10/16/17 10:24 AM  Result Value Ref Range   Sodium 140 135 - 145 mmol/L   Potassium 3.4 (L) 3.5 - 5.1 mmol/L   Chloride 106 101 - 111 mmol/L   CO2 23 22 - 32 mmol/L   Glucose, Bld 154 (H) 65 - 99 mg/dL   BUN 11 6 - 20 mg/dL   Creatinine, Ser 0.83 0.61 - 1.24 mg/dL   Calcium 9.0 8.9 - 10.3 mg/dL   Total Protein 6.6 6.5 - 8.1 g/dL   Albumin 3.8 3.5 - 5.0 g/dL   AST 28 15 - 41 U/L   ALT 14 (L) 17 - 63 U/L   Alkaline Phosphatase 42 38 - 126 U/L   Total Bilirubin 1.7 (H) 0.3 - 1.2 mg/dL   GFR calc non Af Amer >60 >60 mL/min   GFR calc Af Amer >60 >60 mL/min    Comment: (NOTE) The eGFR has been calculated using the CKD EPI equation. This calculation has not been validated in all clinical situations. eGFR's persistently <60 mL/min signify possible Chronic Kidney Disease.    Anion gap 11 5 - 15   No results found.   Assessment/Plan  This a patient with a recurrently incarcerated right inguinal hernia.  It has been reduced now 3 times in the emergency room and the patient was scheduled for surgery this week with Dr. Hampton Blair.  The patient will be admitted to the hospital today and kept n.p.o. after midnight he will be kept at bed rest so this is not re-incarcerate between now and tomorrow.  Dr. Hampton Blair with can then decide when he will operate on the patient but I feel that sending him home again would result in another trip to the emergency room possibly before he is able to have elective repair.  Each time he has that problem he runs the risk of not being able to utilize mesh due to  compromised bowel and translocation.  This was all reviewed with the patient and his daughters we discussed the plan and the risks of bleeding infection and mesh placement he understood and agreed to proceed.  This was discussed with Dr. Reita Cliche in the ED as well.  Damarie Schoolfield E  Burt Knack, MD, FACS

## 2017-10-16 NOTE — ED Notes (Signed)
Dr. Burt Knack to bedside to reduce right inguinal hernia.  Family alerted to immediate plan of care, understanding verbalized.

## 2017-10-16 NOTE — ED Provider Notes (Signed)
[facility] Emergency Department Provider Note ____________________________________________   I have reviewed the triage vital signs and the triage nursing note.  HISTORY  Chief Complaint Hernia (Dx, right lower groin hernia previous provider)   Historian Patient  HPI Jimmy Blair is a 76 y.o. male with right inguinal hernia that is exquisitely painful since waking up this morning.  He is being seen by surgery, and actually has hernia surgery scheduled for this week, with Dr. Hampton Abbot.  Pain is severe.  Positive for nausea without vomiting.  Pain is 10 out of 10.   Past Medical History:  Diagnosis Date  . Anemia of chronic disease 11/21/2015  . Chronic rhinitis 10/03/2017  . Essential hypertension 10/03/2017  . History of amaurosis fugax 10/03/2017  . Hypertension   . Incarcerated right inguinal hernia   . Pure hypercholesterolemia 10/03/2017  . Small bowel obstruction ([place])   . 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 Vaccine counseling 07/29/2016    Patient Active Problem List   Diagnosis Date Noted  . Non-recurrent unilateral inguinal hernia with obstruction without gangrene   . Chronic rhinitis 10/03/2017  . Essential hypertension 10/03/2017  . History of amaurosis fugax 10/03/2017  . Pure hypercholesterolemia 10/03/2017  . Incarcerated right inguinal hernia   . Small bowel obstruction ([place])   . Vaccine counseling 07/29/2016  . Anemia of chronic disease 11/21/2015    Past Surgical History:  Procedure Laterality Date  . NO PAST SURGERIES      Prior to Admission medications   Medication Sig Start Date End Date Taking? Authorizing Provider  aspirin EC 81 MG tablet Take 1 tablet daily by mouth.    [provider]  atorvastatin (LIPITOR) 20 MG tablet Take 1 tablet by mouth every morning.  05/09/17   [provider]  lisinopril (PRINIVIL,ZESTRIL) 40 MG tablet Take 1 tablet by mouth every morning.  07/13/17   [provider]    No Known Allergies  History reviewed. No pertinent family history.  Social History Social History   Tobacco Use  . Smoking status: Never Smoker  . Smokeless tobacco: Never Used  Substance Use Topics  . Alcohol use: Yes    Frequency: Never    Comment: 2-3beer daily  . Drug use: No    Review of Systems  Constitutional: Negative for fever. Eyes: Negative for visual changes. ENT: Negative for sore throat. Cardiovascular: Negative for chest pain. Respiratory: Negative for shortness of breath. Gastrointestinal: Negative for abdominal pain, vomiting and diarrhea. Genitourinary: Pain at right swollen inguinal hernia into the scrotum. Musculoskeletal: Negative for back pain. Skin: Negative for rash. Neurological: Negative for headache.  ____________________________________________   PHYSICAL EXAM:  VITAL SIGNS: ED Triage Vitals  Enc Vitals Group     BP 10/16/17 1008 (!) 151/83     Pulse Rate 10/16/17 1008 74     Resp 10/16/17 1008 18     Temp 10/16/17 1008 98.1 F (36.7 C)     Temp Source 10/16/17 1008 Oral     SpO2 10/16/17 1008 100 %     Weight 10/16/17 1009 139 lb 3.2 oz (63.1 kg)     Height 10/16/17 1009 5\' 10"  (1.778 m)     Head Circumference --      Peak Flow --      Pain Score 10/16/17 1007 10     Pain Loc --      Pain Edu? --      Excl.  in [place]? --      Constitutional: Alert and oriented.  In a lot of pain. HEENT   Head: Normocephalic and atraumatic.      Eyes: Conjunctivae are normal. Pupils equal and round.       Ears:         Nose: No congestion/rhinnorhea.   Mouth/Throat: Mucous membranes are moist.   Neck: No stridor. Cardiovascular/Chest: Normal rate, regular rhythm.  No murmurs, rubs, or gallops. Respiratory: Normal respiratory effort without tachypnea nor retractions. Breath sounds are clear and equal bilaterally. No wheezes/rales/rhonchi. Gastrointestinal: Soft. No distention, no guarding, no rebound. Nontender.     Genitourinary/rectal: Large firm and tender hernia mass in the scrotum. Musculoskeletal: Nontender with normal range of motion in all extremities. No joint effusions.  No lower extremity tenderness.  No edema. Neurologic:  Normal speech and language. No gross or focal neurologic deficits are appreciated. Skin:  Skin is warm, dry and intact. No rash noted. Psychiatric: Mood and affect are normal. Speech and behavior are normal. Patient exhibits appropriate insight and judgment.   ____________________________________________  LABS (pertinent positives/negatives) I, Lisa Roca, MD the attending physician have reviewed the labs noted below.  Labs Reviewed  CBC WITH DIFFERENTIAL/PLATELET - Abnormal; Notable for the following components:      Result Value   RBC 4.22 (*)    Hemoglobin 12.8 (*)    HCT 38.0 (*)    All other components within normal limits  COMPREHENSIVE METABOLIC PANEL - Abnormal; Notable for the following components:   Potassium 3.4 (*)    Glucose, Bld 154 (*)    ALT 14 (*)    Total Bilirubin 1.7 (*)    All other components within normal limits    ____________________________________________    EKG I, Lisa Roca, MD, the attending physician have personally viewed and interpreted all ECGs.  None ____________________________________________  RADIOLOGY All Xrays were viewed by me.  Imaging interpreted by Radiologist, and I, Lisa Roca, MD the attending physician have reviewed the radiologist interpretation noted below.  None __________________________________________  PROCEDURES  Procedure(s) performed: Manual reduction of incarcerated hernia mass - location scrotal/right inguinal.  Trendelenburg position, manual pressure with pain medications (morphine and dilaudid)-- not successful by myself, Dr. Reita Cliche MD  Critical Care performed: None   ____________________________________________  ED COURSE / ASSESSMENT AND PLAN  Pertinent labs & imaging results  that were available during my care of the patient were reviewed by me and considered in my medical decision making (see chart for details).    I was unable to reduce the hernia mass at bedside and Dr. Burt Knack came to bedside and was able to reduce the hernia. Patient more comfortable -- will be admitted for plan for add on to surgery tomorrow.    CONSULTATIONS:   Dr. Burt Knack, general surgery - knows this patient, has reduced this patient recently, states previous was relatively easy to reduce.  Will admit for [place] to likely repair tomorrow, once reduced in ER.   Patient / Family / Caregiver informed of clinical course, medical decision-making process, and agree with plan.   ___________________________________________   FINAL CLINICAL IMPRESSION(S) / ED DIAGNOSES   Final diagnoses:  Incarcerated inguinal hernia      ___________________________________________        Note: This dictation was prepared with Dragon dictation. Any transcriptional errors that result from this process are unintentional    Lisa Roca, MD 10/16/17 1136

## 2017-10-16 NOTE — ED Notes (Signed)
Pt resting comfortably at this time.  Report received from Virgel Manifold, RN regarding patient.  Per Opal Sidles, Dr. Burt Knack reduced

## 2017-10-17 ENCOUNTER — Encounter: Payer: Self-pay | Admitting: Surgery

## 2017-10-17 ENCOUNTER — Encounter
Admission: EM | Disposition: A | Discharge status: Home / Self Care | Payer: Self-pay | Source: Home / Self Care | Attending: Surgery

## 2017-10-17 ENCOUNTER — Inpatient Hospital Stay: Payer: Medicare Other | Admitting: Anesthesiology

## 2017-10-17 DIAGNOSIS — K4030 Unilateral inguinal hernia, with obstruction, without gangrene, not specified as recurrent: Principal | ICD-10-CM

## 2017-10-17 HISTORY — PX: INGUINAL HERNIA REPAIR: SHX194

## 2017-10-17 HISTORY — PX: INSERTION OF MESH: SHX5868

## 2017-10-17 LAB — BASIC METABOLIC PANEL
ANION GAP: 6 (ref 5–15)
BUN: 8 mg/dL (ref 6–20)
CALCIUM: 8.4 mg/dL — AB (ref 8.9–10.3)
CO2: 24 mmol/L (ref 22–32)
Chloride: 108 mmol/L (ref 101–111)
Creatinine, Ser: 0.75 mg/dL (ref 0.61–1.24)
GFR calc Af Amer: >60 mL/min (ref >60)
GFR calc non Af Amer: >60 mL/min (ref >60)
GLUCOSE: 112 mg/dL — AB (ref 65–99)
Potassium: 2.7 mmol/L — CL (ref 3.5–5.1)
Sodium: 138 mmol/L (ref 135–145)

## 2017-10-17 LAB — CBC
HEMATOCRIT: 34.3 % — AB (ref 40.0–52.0)
HEMOGLOBIN: 11.6 g/dL — AB (ref 13.0–18.0)
MCH: 30.2 pg (ref 26.0–34.0)
MCHC: 33.7 g/dL (ref 32.0–36.0)
MCV: 89.7 fL (ref 80.0–100.0)
Platelets: 196 10*3/uL (ref 150–440)
RBC: 3.83 MIL/uL — ABNORMAL LOW (ref 4.40–5.90)
RDW: 13.8 % (ref 11.5–14.5)
WBC: 4.1 10*3/uL (ref 3.8–10.6)

## 2017-10-17 LAB — POTASSIUM: POTASSIUM: 3.3 mmol/L — AB (ref 3.5–5.1)

## 2017-10-17 SURGERY — REPAIR, HERNIA, INGUINAL, ADULT
Anesthesia: General | Laterality: Right

## 2017-10-17 MED ORDER — MIDAZOLAM HCL 2 MG/2ML IJ SOLN
INTRAMUSCULAR | Status: AC
  Filled 2017-10-17: qty 2

## 2017-10-17 MED ORDER — FENTANYL CITRATE (PF) 100 MCG/2ML IJ SOLN: 25.0000 ug | INTRAMUSCULAR | Status: DC | PRN

## 2017-10-17 MED ORDER — FENTANYL CITRATE (PF) 250 MCG/5ML IJ SOLN
INTRAMUSCULAR | Status: AC
  Filled 2017-10-17: qty 5

## 2017-10-17 MED ORDER — BUPIVACAINE HCL (PF) 0.25 % IJ SOLN
INTRAMUSCULAR | Status: DC | PRN
  Administered 2017-10-17: 30 mL

## 2017-10-17 MED ORDER — SUGAMMADEX SODIUM 200 MG/2ML IV SOLN
INTRAVENOUS | Status: AC
  Filled 2017-10-17: qty 2

## 2017-10-17 MED ORDER — ROCURONIUM BROMIDE 50 MG/5ML IV SOLN
INTRAVENOUS | Status: AC
  Filled 2017-10-17: qty 1

## 2017-10-17 MED ORDER — DEXAMETHASONE SODIUM PHOSPHATE 10 MG/ML IJ SOLN
INTRAMUSCULAR | Status: DC | PRN
  Administered 2017-10-17: 10 mg via INTRAVENOUS

## 2017-10-17 MED ORDER — HYDROCODONE-ACETAMINOPHEN 5-325 MG PO TABS: 1.0000 | ORAL_TABLET | ORAL | 0 refills | Status: DC | PRN

## 2017-10-17 MED ORDER — CEFAZOLIN SODIUM-DEXTROSE 1-4 GM/50ML-% IV SOLN
INTRAVENOUS | Status: DC | PRN
  Administered 2017-10-17: 2 g via INTRAVENOUS

## 2017-10-17 MED ORDER — LACTATED RINGERS IV SOLN
INTRAVENOUS | Status: DC | PRN
  Administered 2017-10-17: 13:00:00 via INTRAVENOUS

## 2017-10-17 MED ORDER — FENTANYL CITRATE (PF) 100 MCG/2ML IJ SOLN
INTRAMUSCULAR | Status: DC | PRN
  Administered 2017-10-17 (×2): 50 ug via INTRAVENOUS

## 2017-10-17 MED ORDER — POTASSIUM CHLORIDE 10 MEQ/100ML IV SOLN
10.0000 meq | INTRAVENOUS | Status: DC
  Administered 2017-10-17 (×4): 10 meq via INTRAVENOUS
  Filled 2017-10-17 (×4): qty 100

## 2017-10-17 MED ORDER — EPHEDRINE SULFATE 50 MG/ML IJ SOLN
INTRAMUSCULAR | Status: DC | PRN
  Administered 2017-10-17 (×2): 10 mg via INTRAVENOUS

## 2017-10-17 MED ORDER — SODIUM CHLORIDE 0.9 % IV SOLN
INTRAVENOUS | Status: DC
  Administered 2017-10-17 (×2): via INTRAVENOUS

## 2017-10-17 MED ORDER — ONDANSETRON HCL 4 MG/2ML IJ SOLN
INTRAMUSCULAR | Status: AC
  Filled 2017-10-17: qty 2

## 2017-10-17 MED ORDER — ACETAMINOPHEN 10 MG/ML IV SOLN
INTRAVENOUS | Status: AC
  Filled 2017-10-17: qty 100

## 2017-10-17 MED ORDER — FENTANYL CITRATE (PF) 100 MCG/2ML IJ SOLN
INTRAMUSCULAR | Status: AC
  Filled 2017-10-17: qty 2

## 2017-10-17 MED ORDER — DEXMEDETOMIDINE HCL IN NACL 80 MCG/20ML IV SOLN
INTRAVENOUS | Status: AC
  Filled 2017-10-17: qty 20

## 2017-10-17 MED ORDER — LIDOCAINE HCL (CARDIAC) 20 MG/ML IV SOLN
INTRAVENOUS | Status: DC | PRN
  Administered 2017-10-17: 80 mg via INTRAVENOUS

## 2017-10-17 MED ORDER — BUPIVACAINE LIPOSOME 1.3 % IJ SUSP
INTRAMUSCULAR | Status: DC | PRN
  Administered 2017-10-17: 20 mL

## 2017-10-17 MED ORDER — ONDANSETRON HCL 4 MG/2ML IJ SOLN: 4.0000 mg | Freq: Once | INTRAMUSCULAR | Status: DC | PRN

## 2017-10-17 MED ORDER — DEXAMETHASONE SODIUM PHOSPHATE 10 MG/ML IJ SOLN
INTRAMUSCULAR | Status: AC
  Filled 2017-10-17: qty 1

## 2017-10-17 MED ORDER — PROPOFOL 10 MG/ML IV BOLUS
INTRAVENOUS | Status: AC
  Filled 2017-10-17: qty 20

## 2017-10-17 MED ORDER — CEFAZOLIN SODIUM 1 G IJ SOLR
INTRAMUSCULAR | Status: AC
  Filled 2017-10-17: qty 20

## 2017-10-17 MED ORDER — PROPOFOL 10 MG/ML IV BOLUS
INTRAVENOUS | Status: DC | PRN
  Administered 2017-10-17: 140 mg via INTRAVENOUS
  Administered 2017-10-17: 60 mg via INTRAVENOUS

## 2017-10-17 MED ORDER — POTASSIUM CHLORIDE 10 MEQ/100ML IV SOLN
10.0000 meq | Freq: Once | INTRAVENOUS | Status: AC
  Administered 2017-10-17: 10 meq via INTRAVENOUS
  Filled 2017-10-17: qty 100

## 2017-10-17 MED ORDER — PHENYLEPHRINE HCL 10 MG/ML IJ SOLN
INTRAMUSCULAR | Status: DC | PRN
  Administered 2017-10-17: 200 ug via INTRAVENOUS
  Administered 2017-10-17: 100 ug via INTRAVENOUS
  Administered 2017-10-17: 200 ug via INTRAVENOUS

## 2017-10-17 MED ORDER — PHENYLEPHRINE HCL 10 MG/ML IJ SOLN
INTRAMUSCULAR | Status: AC
  Filled 2017-10-17: qty 1

## 2017-10-17 MED ORDER — EPHEDRINE SULFATE 50 MG/ML IJ SOLN
INTRAMUSCULAR | Status: AC
  Filled 2017-10-17: qty 1

## 2017-10-17 MED ORDER — SUGAMMADEX SODIUM 200 MG/2ML IV SOLN
INTRAVENOUS | Status: DC | PRN
  Administered 2017-10-17: 130 mg via INTRAVENOUS

## 2017-10-17 MED ORDER — LIDOCAINE HCL (PF) 2 % IJ SOLN
INTRAMUSCULAR | Status: AC
  Filled 2017-10-17: qty 10

## 2017-10-17 MED ORDER — ROCURONIUM BROMIDE 100 MG/10ML IV SOLN
INTRAVENOUS | Status: DC | PRN
  Administered 2017-10-17: 20 mg via INTRAVENOUS
  Administered 2017-10-17: 30 mg via INTRAVENOUS
  Administered 2017-10-17: 20 mg via INTRAVENOUS
  Administered 2017-10-17: 30 mg via INTRAVENOUS

## 2017-10-17 MED ORDER — DEXTROSE 5 % IV SOLN
2.0000 g | Freq: Once | INTRAVENOUS | Status: AC
  Administered 2017-10-17: 2 g via INTRAVENOUS
  Filled 2017-10-17: qty 2000

## 2017-10-17 MED ORDER — BUPIVACAINE LIPOSOME 1.3 % IJ SUSP
INTRAMUSCULAR | Status: AC
  Filled 2017-10-17: qty 20

## 2017-10-17 SURGICAL SUPPLY — 35 items
BLADE CLIPPER SURG (BLADE) ×3 IMPLANT
BLADE SURG 15 STRL LF DISP TIS (BLADE) ×1 IMPLANT
BLADE SURG 15 STRL SS (BLADE) ×2
CANISTER SUCT 1200ML W/VALVE (MISCELLANEOUS) ×3 IMPLANT
CHLORAPREP W/TINT 26ML (MISCELLANEOUS) ×3 IMPLANT
DERMABOND ADVANCED (GAUZE/BANDAGES/DRESSINGS) ×2
DERMABOND ADVANCED .7 DNX12 (GAUZE/BANDAGES/DRESSINGS) ×1 IMPLANT
DRAIN PENROSE 1/4X12 LTX (DRAIN) ×3 IMPLANT
DRAPE LAPAROTOMY 100X77 ABD (DRAPES) ×3 IMPLANT
ELECT CAUTERY BLADE 6.4 (BLADE) IMPLANT
ELECT REM PT RETURN 9FT ADLT (ELECTROSURGICAL) ×3
ELECTRODE REM PT RTRN 9FT ADLT (ELECTROSURGICAL) ×1 IMPLANT
GLOVE SURG SYN 7.0 (GLOVE) ×6 IMPLANT
GLOVE SURG SYN 7.5  E (GLOVE) ×4
GLOVE SURG SYN 7.5 E (GLOVE) ×2 IMPLANT
GOWN STRL REUS W/ TWL LRG LVL3 (GOWN DISPOSABLE) ×2 IMPLANT
GOWN STRL REUS W/TWL LRG LVL3 (GOWN DISPOSABLE) ×4
LABEL OR SOLS (LABEL) IMPLANT
MESH MARLEX PLUG MEDIUM (Mesh General) ×3 IMPLANT
NEEDLE HYPO 22GX1.5 SAFETY (NEEDLE) ×3 IMPLANT
NS IRRIG 500ML POUR BTL (IV SOLUTION) ×3 IMPLANT
PACK BASIN MINOR ARMC (MISCELLANEOUS) ×3 IMPLANT
SPONGE LAP 18X18 5 PK (GAUZE/BANDAGES/DRESSINGS) ×3 IMPLANT
SUT MNCRL 4-0 (SUTURE) ×2
SUT MNCRL 4-0 27XMFL (SUTURE) ×1
SUT PROLENE 2 0 SH DA (SUTURE) ×6 IMPLANT
SUT SILK 0 (SUTURE) ×2
SUT SILK 0 30XBRD TIE 6 (SUTURE) ×1 IMPLANT
SUT VIC AB 2-0 CT1 (SUTURE) ×3 IMPLANT
SUT VIC AB 3-0 SH 27 (SUTURE) ×2
SUT VIC AB 3-0 SH 27X BRD (SUTURE) ×1 IMPLANT
SUTURE MNCRL 4-0 27XMF (SUTURE) ×1 IMPLANT
SYR 10ML LL (SYRINGE) ×3 IMPLANT
SYR 30ML LL (SYRINGE) ×3 IMPLANT
SYR BULB IRRIG 60ML STRL (SYRINGE) IMPLANT

## 2017-10-17 NOTE — Anesthesia Post-op Follow-up Note (Signed)
Anesthesia QCDR form completed.        

## 2017-10-17 NOTE — Anesthesia Procedure Notes (Signed)
Procedure Name: Intubation Date/Time: 10/17/2017 1:36 PM Performed by: Iver Nestle, MD Pre-anesthesia Checklist: Patient identified, Emergency Drugs available, Suction available, Patient being monitored and Timeout performed Patient Re-evaluated:Patient Re-evaluated prior to induction Oxygen Delivery Method: Circle system utilized Preoxygenation: Pre-oxygenation with 100% oxygen Induction Type: IV induction Ventilation: Mask ventilation without difficulty Laryngoscope Size: Mac and 4 Grade View: Grade I Tube type: Oral Tube size: 7.5 mm Number of attempts: 1 Airway Equipment and Method: Stylet Placement Confirmation: ETT inserted through vocal cords under direct vision,  positive ETCO2 and breath sounds checked- equal and bilateral Secured at: 23 cm Tube secured with: Tape Dental Injury: Teeth and Oropharynx as per pre-operative assessment

## 2017-10-17 NOTE — Transfer of Care (Signed)
Immediate Anesthesia Transfer of Care Note  Patient: Jimmy Blair  Procedure(s) Performed: HERNIA REPAIR INGUINAL ADULT (Right ) INSERTION OF MESH (Right )  Patient Location: PACU  Anesthesia Type:General  Level of Consciousness: drowsy and patient cooperative  Airway & Oxygen Therapy: Patient Spontanous Breathing and Patient connected to face mask oxygen  Post-op Assessment: Report given to RN and Post -op Vital signs reviewed and stable  Post vital signs: Reviewed and stable  Last Vitals:  Vitals:   10/17/17 1302 10/17/17 1546  BP: (!) 158/88 100/65  Pulse:  83  Resp: 18 19  Temp: 37.2 C 37.2 C  SpO2: 100% 100%    Last Pain:  Vitals:   10/17/17 1302  TempSrc: Tympanic  PainSc:          Complications: No apparent anesthesia complications

## 2017-10-17 NOTE — Progress Notes (Signed)
Mychael Squitieri  A and O x 4. VSS. Pt tolerating diet well. No complaints of pain or nausea. IV removed intact, prescriptions given. Pt voiced understanding of discharge instructions with no further questions. Pt discharged via wheelchair with nurse tech.    Allergies as of 10/17/2017   No Known Allergies     Medication List    TAKE these medications   aspirin EC 81 MG tablet Take 1 tablet daily by mouth.   atorvastatin 20 MG tablet Commonly known as:  LIPITOR Take 1 tablet by mouth every morning.   HYDROcodone-acetaminophen 5-325 MG tablet Commonly known as:  NORCO/VICODIN Take 1-2 tablets by mouth every 4 (four) hours as needed for moderate pain.   lisinopril 40 MG tablet Commonly known as:  PRINIVIL,ZESTRIL Take 1 tablet by mouth every morning.       Vitals:   10/17/17 1707 10/17/17 1802  BP: (!) 137/93 136/72  Pulse: 79 85  Resp: 16 17  Temp: 98.6 F (37 C) 98 F (36.7 C)  SpO2: 96% 96%    Francesco Sor

## 2017-10-17 NOTE — OR Nursing (Signed)
Pt. Voided pre-op 262ml.

## 2017-10-17 NOTE — Discharge Summary (Signed)
Patient ID: Jimmy Blair MRN: [id number] DOB/AGE: 06-05-1941 76 y.o.  Admit date: 10/16/2017 Discharge date: 10/17/2017   Discharge Diagnoses:  Active Problems:   Incarcerated inguinal hernia, unilateral   Procedures:  Right inguinal hernia repair with mesh  Hospital Course:  Patient was admitted on 12/16 after reducing a right inguinal hernia in the ED for surgical management.  He was taken to the OR on 78/95 without complications.  Post-operatively, he was feeling well without any significant pain.  He was ambulating and tolerating liquids.  Denies any nausea.  He is ready for discharge to home.  On exam, he was in no acute distress with stable vital signs.  His right groin was clean, dry, intact with some expected swelling.  No ecchymosis.  Consults: None  Disposition: 01-Home or Self Care  Discharge Instructions    Call MD for:  difficulty breathing, headache or visual disturbances   Complete by:  As directed    Call MD for:  persistant nausea and vomiting   Complete by:  As directed    Call MD for:  redness, tenderness, or signs of infection (pain, swelling, redness, odor or green/yellow discharge around incision site)   Complete by:  As directed    Call MD for:  severe uncontrolled pain   Complete by:  As directed    Call MD for:  temperature >100.4   Complete by:  As directed    Diet - low sodium heart healthy   Complete by:  As directed    Discharge instructions   Complete by:  As directed    1.  Patient may shower, but do not scrub wound heavily and dab dry only. 2.  Do not submerge wounds in pool/tub 3.  Do not apply ointments or hydrogen peroxide to the wound.   Driving Restrictions   Complete by:  As directed    Do not drive while taking narcotics for pain control.   Increase activity slowly   Complete by:  As directed    Lifting restrictions   Complete by:  As directed    No heavy lifting or pushing of more than 10-15 lbs for 4 weeks.   No dressing  needed   Complete by:  As directed      Allergies as of 10/17/2017   No Known Allergies     Medication List    TAKE these medications   aspirin EC 81 MG tablet Take 1 tablet daily by mouth.   atorvastatin 20 MG tablet Commonly known as:  LIPITOR Take 1 tablet by mouth every morning.   HYDROcodone-acetaminophen 5-325 MG tablet Commonly known as:  NORCO/VICODIN Take 1-2 tablets by mouth every 4 (four) hours as needed for moderate pain.   lisinopril 40 MG tablet Commonly known as:  PRINIVIL,ZESTRIL Take 1 tablet by mouth every morning.      Follow-up Information    Leslee Haueter, Jacqulyn Bath, MD Follow up in 2 week(s).   Specialty:  Surgery Why:  Follow up in 2-3 weeks. Contact information: [place] [id number] [phone]

## 2017-10-17 NOTE — Addendum Note (Signed)
Addendum  created 10/17/17 1624 by Molli Barrows, MD   Order list changed, Order sets accessed

## 2017-10-17 NOTE — Anesthesia Preprocedure Evaluation (Signed)
Anesthesia Evaluation  Patient identified by MRN, date of birth, ID band Patient awake    Reviewed: Allergy & Precautions, NPO status   Airway Mallampati: II       Dental  (+) Upper Dentures, Poor Dentition   Pulmonary neg pulmonary ROS,    breath sounds clear to auscultation       Cardiovascular Exercise Tolerance: Good hypertension, Pt. on medications  Rhythm:Regular Rate:Normal     Neuro/Psych negative neurological ROS  negative psych ROS   GI/Hepatic negative GI ROS, Neg liver ROS,   Endo/Other  negative endocrine ROS  Renal/GU negative Renal ROS     Musculoskeletal negative musculoskeletal ROS (+)   Abdominal Normal abdominal exam  (+)   Peds negative pediatric ROS (+)  Hematology  (+) anemia ,   Anesthesia Other Findings   Reproductive/Obstetrics                             Anesthesia Physical Anesthesia Plan  ASA: II  Anesthesia Plan: General   Post-op Pain Management:    Induction: Intravenous  PONV Risk Score and Plan:   Airway Management Planned: Oral ETT  Additional Equipment:   Intra-op Plan:   Post-operative Plan: Extubation in OR  Informed Consent: I have reviewed the patients History and Physical, chart, labs and discussed the procedure including the risks, benefits and alternatives for the proposed anesthesia with the patient or authorized representative who has indicated his/her understanding and acceptance.     Plan Discussed with: CRNA  Anesthesia Plan Comments:         Anesthesia Quick Evaluation

## 2017-10-17 NOTE — Anesthesia Postprocedure Evaluation (Signed)
Anesthesia Post Note  Patient: Jimmy Blair  Procedure(s) Performed: HERNIA REPAIR INGUINAL ADULT (Right ) INSERTION OF MESH (Right )  Patient location during evaluation: PACU Anesthesia Type: General Level of consciousness: awake and alert Pain management: pain level controlled Vital Signs Assessment: post-procedure vital signs reviewed and stable Respiratory status: spontaneous breathing, nonlabored ventilation, respiratory function stable and patient connected to nasal cannula oxygen Cardiovascular status: blood pressure returned to baseline and stable Postop Assessment: no apparent nausea or vomiting Anesthetic complications: no     Last Vitals:  Vitals:   10/17/17 1302 10/17/17 1546  BP: (!) 158/88 100/65  Pulse:  83  Resp: 18 19  Temp: 37.2 C 37.2 C  SpO2: 100% 100%    Last Pain:  Vitals:   10/17/17 1546  TempSrc:   PainSc: [place] Eliabeth Shoff

## 2017-10-17 NOTE — Op Note (Signed)
  Procedure Date:  10/17/2017  Pre-operative Diagnosis:  Incarcerated right inguinal hernia  Post-operative Diagnosis: Reducible right inguinal hernia  Procedure:  Right Inguinal Hernia Repair with mesh  Surgeon:  Melvyn Neth, MD  Anesthesia:  General endotracheal  Estimated Blood Loss:  20 ml  Specimens:  Right cord lipoma and hernia sac  Complications:  None  Indications for Procedure:  This is a 76 y.o. male who presents with a right inguinal hernia, initially incarcerated but reduced in the Emergency Department.  The options of surgery versus observation were reviewed with the patient and/or family. The risks of bleeding, abscess or infection, recurrence of symptoms, potential for an open procedure, injury to surrounding structures, and chronic pain were all discussed with the patient and was willing to proceed.  Description of Procedure: The patient was correctly identified in the preoperative area and brought into the operating room.  The patient was placed supine with VTE prophylaxis in place.  Appropriate time-outs were performed.  Anesthesia was induced and the patient was intubated.  Appropriate antibiotics were infused.  The right groin and lower abdomen were prepped and draped in a sterile fashion. An oblique incision was made between the pubic symphysis extending laterally toward the ASIS. Using electrocautery, the subcutaneous tissues were dissected, assuring adequate hemostasis, until reaching the external oblique aponeurosis.  A 1 cm incision was made over the aponeurosis and extended laterally and medially toward the external inguinal ring avoiding injury to the ilioinguinal nerve.  The cord was encircled using a Penrose drain and using medial and lateral retraction, the cord structures were identified and the hernia sac was identified and dissected free.  A cord lipoma was encountered and it was dissected, ligated with a 2-0 silk tie and resected. The sac was entered  and freed of any contents carefully and then ligated with 2-0 Silk tie and resected.    A Bard plug was placed in the opening of the internal ring and secured in place to the conjoined tendon and floor with 2-0 Prolene suture x 2.  A Bard mesh was then placed and sutured to the pubic tubercle, shelving edge, and conjoined tendon with 2-0 Prolene sutures.  The tails of the mesh were crossed behind the cord and sutured together creating a new internal ring.  The external oblique was then closed in running fashion with 2-0 Vicryl, creating a new external ring.  The wound was irrigated and the incision was closed in two layers with interrupted 3-0 Vicryl and running 4-0 Monocryl.  The wound was cleaned and sealed with DermaBond.  The patient was emerged from anesthesia and extubated and brought to the recovery room for further management.  The patient tolerated the procedure well and all counts were correct at the end of the case.   Melvyn Neth, MD

## 2017-10-17 NOTE — Progress Notes (Signed)
10/17/2017  Subjective: No acute events overnight, except for low potassium this morning.  Started IV repletion this morning.  Denies any groin pain at this point.  No nausea or vomiting.  Vital signs: Temp:  [98.2 F (36.8 C)-99.2 F (37.3 C)] 99.2 F (37.3 C) (12/17 0444) Pulse Rate:  [68-92] 68 (12/17 0444) Resp:  [13-20] 18 (12/17 0444) BP: (105-140)/(65-80) 140/73 (12/17 0444) SpO2:  [96 %-100 %] 97 % (12/17 0444)   Intake/Output: 12/16 0701 - 12/17 0700 In: 1000 [IV Piggyback:1000] Out: 950 [Urine:950] Last BM Date: 10/15/17  Physical Exam: Constitutional: No acute distress Abdomen:  Soft, nondistended, nontender to palpation.  Right groin with reduced hernia and no bulging at this point.    Labs:  Recent Labs    10/16/17 1024 10/17/17 0511  WBC 5.6 4.1  HGB 12.8* 11.6*  HCT 38.0* 34.3*  PLT 208 196   Recent Labs    10/16/17 1024 10/17/17 0511  NA 140 138  K 3.4* 2.7*  CL 106 108  CO2 23 24  GLUCOSE 154* 112*  BUN 11 8  CREATININE 0.83 0.75  CALCIUM 9.0 8.4*   No results for input(s): LABPROT, INR in the last 72 hours.  Imaging: No results found.  Assessment/Plan: 76 yo male with right inguinal hernia.  --continue IV potassium repletion and repeat K check this morning.  If > 3, can proceed with surgery at 12:30 pm. --Discussed with patient the surgery which would be a right inguinal hernia repair with mesh.  Risks of bleeding, infection, and injury to surrounding structures were discussed and he is willing to proceed. --Continue NPO, IV fluid hydration.   Melvyn Neth, [place]

## 2017-10-19 ENCOUNTER — Ambulatory Visit: Admission: RE | Admit: 2017-10-19 | Payer: Medicare Other | Source: Ambulatory Visit | Admitting: Surgery

## 2017-10-19 LAB — SURGICAL PATHOLOGY

## 2017-11-07 ENCOUNTER — Ambulatory Visit (INDEPENDENT_AMBULATORY_CARE_PROVIDER_SITE_OTHER): Payer: Medicare Other | Admitting: Surgery

## 2017-11-07 ENCOUNTER — Encounter: Payer: Self-pay | Admitting: Surgery

## 2017-11-07 VITALS — BP 143/75 | HR 88 | Temp 97.6°F | Ht 70.0 in | Wt 150.2 lb

## 2017-11-07 DIAGNOSIS — Z09 Encounter for follow-up examination after completed treatment for conditions other than malignant neoplasm: Secondary | ICD-10-CM

## 2017-11-07 NOTE — Progress Notes (Signed)
11/07/2017  HPI: Patient is status post right inguinal hernia repair with mesh on 10/17/17.  He presents today for postop follow-up.  Currently denies having any right groin pain, drainage, or issues with the incision.  Reports only some mild soreness.  He stopped requiring any pain medication shortly after surgery.  Denies any new bulging or hernia recurrence.  Has been tolerating a diet and having normal bowel movements.  Vital signs: BP (!) 143/75   Pulse 88   Temp 97.6 F (36.4 C) (Oral)   Ht 5\' 10"  (1.778 m)   Wt 68.1 kg (150 lb 3.2 oz)   BMI 21.55 kg/m    Physical Exam: Constitutional: No acute distress Abdomen: Soft, nondistended, nontender to palpation.  Incision is healing well and is clean, dry, and intact.  There is mild hardening underneath the incision which is consistent with postop changes with no evidence of hernia recurrence.  No evidence of infection.  Assessment/Plan: 77 year old male status post right anal hernia repair with mesh.  -Discussed with the patient that he still has a few more weeks of no heavy lifting or pushing restriction and that on January 28, he may resume his usual activities.  There is no dietary restriction at this point. -Acute pathology with the patient.  There was a hernia sac and a right cord lipoma that were sent which were negative for malignancy. -Patient may follow-up with [place] on an as-needed basis.   Melvyn Neth, [place]

## 2017-11-07 NOTE — Patient Instructions (Signed)

## 2018-11-27 ENCOUNTER — Emergency Department
Admission: EM | Admit: 2018-11-27 | Discharge: 2018-11-27 | Disposition: A | Discharge status: Home / Self Care | Payer: Medicare Other | Attending: Emergency Medicine | Admitting: Emergency Medicine

## 2018-11-27 ENCOUNTER — Emergency Department: Payer: Medicare Other

## 2018-11-27 ENCOUNTER — Encounter: Payer: Self-pay | Admitting: Emergency Medicine

## 2018-11-27 DIAGNOSIS — R2242 Localized swelling, mass and lump, left lower limb: Secondary | ICD-10-CM | POA: Diagnosis present

## 2018-11-27 DIAGNOSIS — I82812 Embolism and thrombosis of superficial veins of left lower extremities: Secondary | ICD-10-CM | POA: Insufficient documentation

## 2018-11-27 DIAGNOSIS — I10 Essential (primary) hypertension: Secondary | ICD-10-CM | POA: Diagnosis not present

## 2018-11-27 DIAGNOSIS — Z79899 Other long term (current) drug therapy: Secondary | ICD-10-CM | POA: Diagnosis not present

## 2018-11-27 LAB — CBC
HCT: 41 % (ref 39.0–52.0)
Hemoglobin: 12.7 g/dL — ABNORMAL LOW (ref 13.0–17.0)
MCH: 28.5 pg (ref 26.0–34.0)
MCHC: 31 g/dL (ref 30.0–36.0)
MCV: 92.1 fL (ref 80.0–100.0)
PLATELETS: 204 10*3/uL (ref 150–400)
RBC: 4.45 MIL/uL (ref 4.22–5.81)
RDW: 13.4 % (ref 11.5–15.5)
WBC: 4.8 10*3/uL (ref 4.0–10.5)
nRBC: 0 % (ref 0.0–0.2)

## 2018-11-27 LAB — COMPREHENSIVE METABOLIC PANEL
ALBUMIN: 4.6 g/dL (ref 3.5–5.0)
ALT: 16 U/L (ref 0–44)
ANION GAP: 8 (ref 5–15)
AST: 20 U/L (ref 15–41)
Alkaline Phosphatase: 42 U/L (ref 38–126)
BILIRUBIN TOTAL: 2 mg/dL — AB (ref 0.3–1.2)
BUN: 11 mg/dL (ref 8–23)
CHLORIDE: 111 mmol/L (ref 98–111)
CO2: 22 mmol/L (ref 22–32)
Calcium: 9.1 mg/dL (ref 8.9–10.3)
Creatinine, Ser: 0.84 mg/dL (ref 0.61–1.24)
GFR calc Af Amer: >60 mL/min (ref >60)
GLUCOSE: 102 mg/dL — AB (ref 70–99)
POTASSIUM: 3.5 mmol/L (ref 3.5–5.1)
Sodium: 141 mmol/L (ref 135–145)
Total Protein: 7.6 g/dL (ref 6.5–8.1)

## 2018-11-27 MED ORDER — RIVAROXABAN (XARELTO) VTE STARTER PACK (15 & 20 MG): ORAL_TABLET | ORAL | 0 refills | Status: AC

## 2018-11-27 NOTE — ED Notes (Signed)
ED Provider at bedside. 

## 2018-11-27 NOTE — ED Triage Notes (Signed)
C/O left lower leg swelling x 1 week.  Sent from [facility] for evaluation

## 2018-11-27 NOTE — ED Provider Notes (Signed)
[facility] Emergency Department Provider Note   ____________________________________________    I have reviewed the triage vital signs and the nursing notes.   HISTORY  Chief Complaint Leg Swelling     HPI Jimmy Blair is a 78 y.o. male who presents with complaints of left leg swelling.  Patient is not entirely clear how long his leg is been swelling he says perhaps a couple of days although his daughter thinks it may have been longer.  He denies pain he just has noticed that his leg has been swollen around the calf on the left.  No fever redness.  No shortness of breath.  No pleurisy.  No nausea or vomiting.  No history of DVT.  No injury.  Has not taken anything for this  Past Medical History:  Diagnosis Date  . Anemia of chronic disease 11/21/2015  . Chronic rhinitis 10/03/2017  . Essential hypertension 10/03/2017  . History of amaurosis fugax 10/03/2017  . Hypertension   . Incarcerated right inguinal hernia   . Pure hypercholesterolemia 10/03/2017  . Small bowel obstruction ([place])   . 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 Vaccine counseling 07/29/2016    Patient Active Problem List   Diagnosis Date Noted  . Incarcerated inguinal hernia, unilateral 10/16/2017  . Incarcerated inguinal hernia   . Non-recurrent unilateral inguinal hernia with obstruction without gangrene   . Chronic rhinitis 10/03/2017  . Essential hypertension 10/03/2017  . History of amaurosis fugax 10/03/2017  . Pure hypercholesterolemia 10/03/2017  . Incarcerated right inguinal hernia   . Small bowel obstruction ([place])   . Vaccine counseling 07/29/2016  . Anemia of chronic disease 11/21/2015    Past Surgical History:  Procedure Laterality Date  . INGUINAL HERNIA REPAIR Right 10/17/2017   Procedure: HERNIA REPAIR INGUINAL ADULT;  Surgeon: Olean Ree, MD;  Location: [facility] ORS;  Service: General;  Laterality: Right;  . INSERTION OF MESH Right 10/17/2017     Procedure: INSERTION OF MESH;  Surgeon: Olean Ree, MD;  Location: [facility] ORS;  Service: General;  Laterality: Right;  . NO PAST SURGERIES      Prior to Admission medications   Medication Sig Start Date End Date Taking? Authorizing Provider  atorvastatin (LIPITOR) 20 MG tablet Take 1 tablet by mouth every morning.  05/09/17   [provider]  lisinopril (PRINIVIL,ZESTRIL) 40 MG tablet Take 1 tablet by mouth every morning.  07/13/17   [provider]  Rivaroxaban 15 & 20 MG TBPK Take as directed on package: Start with one 15mg  tablet by mouth twice a day with food. On Day 22, switch to one 20mg  tablet once a day with food. 11/27/18   Lavonia Drafts, MD     Allergies Patient has no known allergies.  Family History  Problem Relation Age of Onset  . Alzheimer's disease Mother   . Colon cancer Brother     Social History Social History   Tobacco Use  . Smoking status: Never Smoker  . Smokeless tobacco: Never Used  Substance Use Topics  . Alcohol use: Yes    Frequency: Never    Comment: 2-3beer daily  . Drug use: No    Review of Systems  Constitutional: No fever/chills Eyes: No visual changes.  ENT: No neck pain Cardiovascular: Denies chest pain.  No pleurisy Respiratory: Denies shortness of breath.  No pleurisy gastrointestinal: No abdominal pain.   Genitourinary: No pain in the groin Musculoskeletal: No leg pain, swelling  as above Skin: Negative for rash or pallor Neurological: Negative for headaches   ____________________________________________   PHYSICAL EXAM:  VITAL SIGNS: ED Triage Vitals  Enc Vitals Group     BP 11/27/18 0957 (!) 150/86     Pulse Rate 11/27/18 0957 87     Resp 11/27/18 0957 16     Temp 11/27/18 0957 97.7 F (36.5 C)     Temp Source 11/27/18 0957 Oral     SpO2 11/27/18 0957 99 %     Weight 11/27/18 0953 68.1 kg (150 lb 2.1 oz)     Height --      Head Circumference --      Peak Flow --      Pain Score 11/27/18 0953 0      Pain Loc --      Pain Edu? --      Excl. in [place]? --     Constitutional: Alert and oriented.  Eyes: Conjunctivae are normal.   Nose: No congestion/rhinnorhea. Mouth/Throat: Mucous membranes are moist.    Cardiovascular: Normal rate, regular rhythm. Grossly normal heart sounds.  Good peripheral circulation. Respiratory: Normal respiratory effort.  No retractions. Lungs CTAB. Gastrointestinal: Soft and nontender. No distention.    Musculoskeletal: 1+ edema to the left lower extremity, no erythema, 2+ distal pulses, no tenderness to palpation.  Normal strength Neurologic:  Normal speech and language. No gross focal neurologic deficits are appreciated.  Skin:  Skin is warm, dry and intact. No rash noted. Psychiatric: Mood and affect are normal. Speech and behavior are normal.  ____________________________________________   LABS (all labs ordered are listed, but only abnormal results are displayed)  Labs Reviewed  CBC - Abnormal; Notable for the following components:      Result Value   Hemoglobin 12.7 (*)    All other components within normal limits  COMPREHENSIVE METABOLIC PANEL - Abnormal; Notable for the following components:   Glucose, Bld 102 (*)    Total Bilirubin 2.0 (*)    All other components within normal limits   ____________________________________________  EKG  None ____________________________________________  RADIOLOGY  Ultrasound demonstrates SVT, nonocclusive of the lesser saphenous vein ____________________________________________   PROCEDURES  Procedure(s) performed: No  Procedures   Critical Care performed: No ____________________________________________   INITIAL IMPRESSION / ASSESSMENT AND PLAN / ED COURSE  Pertinent labs & imaging results that were available during my care of the patient were reviewed by me and considered in my medical decision making (see chart for details).  Patient with superficial vein thrombosis somewhat  symptomatic given the swelling discussed with patient and daughter they would like to do anticoagulation for 45 days with outpatient follow-up.  Pending labs  Lab work reassuring, will prescribe Xarelto    ____________________________________________   FINAL CLINICAL IMPRESSION(S) / ED DIAGNOSES  Final diagnoses:  Acute superficial venous thrombosis of lower extremity, left        Note:  This document was prepared using Dragon voice recognition software and may include unintentional dictation errors.   Lavonia Drafts, MD 11/27/18 1311

## 2018-11-27 NOTE — ED Notes (Signed)
Pt alert and oriented X4, active, cooperative, pt in NAD. RR even and unlabored, color WNL.  Pt informed to return if any life threatening symptoms occur.  Discharge and followup instructions reviewed. Pt to take BP medication when he gets home, has not taken his prescribed today. Ambulates safely.

## 2018-12-07 DIAGNOSIS — I82812 Embolism and thrombosis of superficial veins of left lower extremities: Secondary | ICD-10-CM | POA: Insufficient documentation

## 2019-02-28 IMAGING — CT CT ABD-PELV W/ CM
2 of 6 series · 15 of 46 positions shown, 17 images · IV contrast (APPLIED)
Comparison: None.

CLINICAL DATA: Abdominal pain and vomiting

EXAM:
CT ABDOMEN AND PELVIS WITH CONTRAST
TECHNIQUE: Multidetector CT imaging of the abdomen and pelvis was performed
using the standard protocol following bolus administration of
intravenous contrast.
CONTRAST:  100mL [id number] IOPAMIDOL (7P9GL5-VGG) INJECTION 61%

[Series 2: routine abd/pel with · axial · 0.64mm/px · z∈[-501,-126]mm · 12 of 87 slices shown, 14 images]
[im 6/87  soft-tissue]
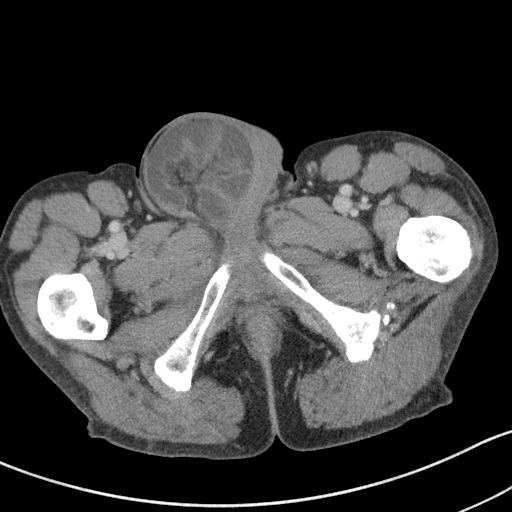
[im 6/87  bone]
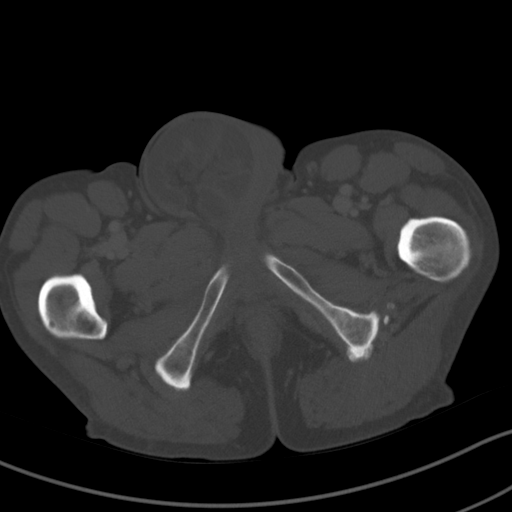
[im 12/87  soft-tissue]
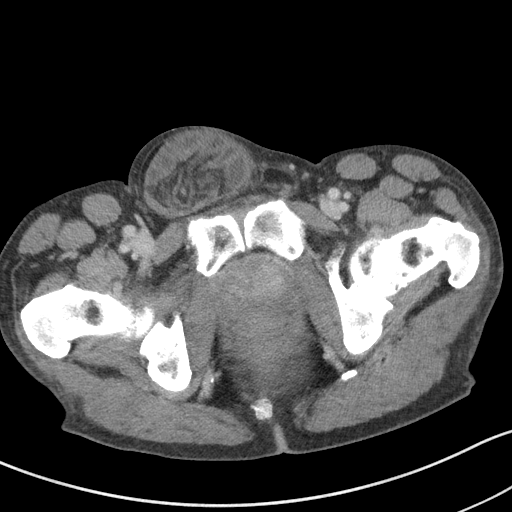
[im 18/87  soft-tissue]
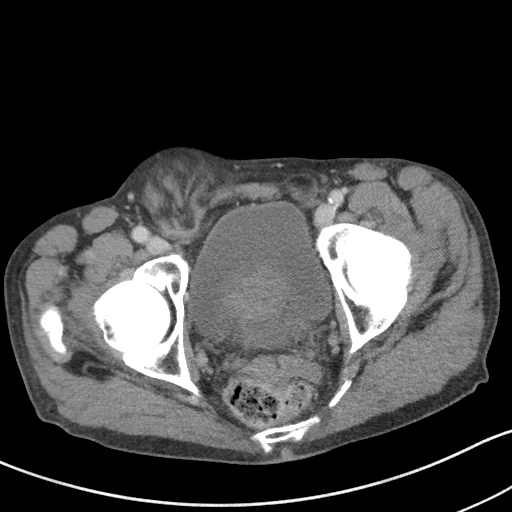
[im 29/87  soft-tissue]
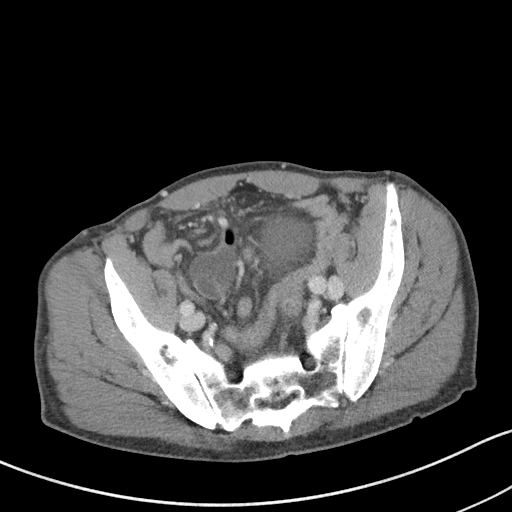
[im 35/87  soft-tissue]
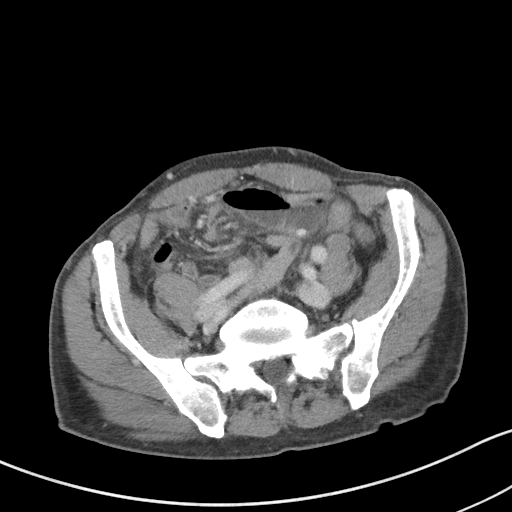
[im 41/87  soft-tissue]
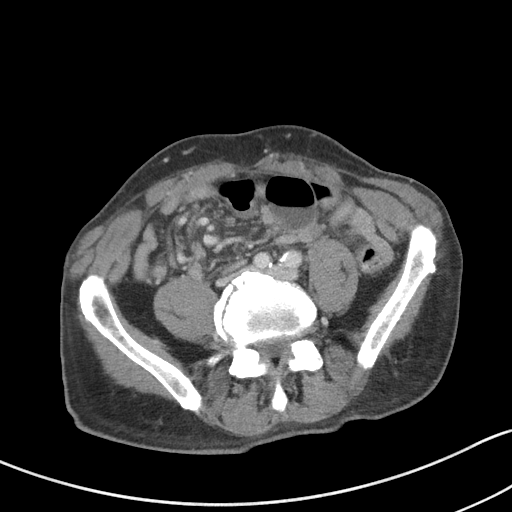
[im 46/87  soft-tissue]
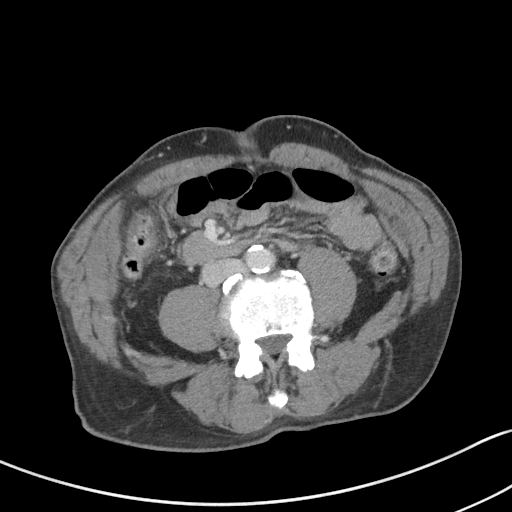
[im 52/87  soft-tissue]
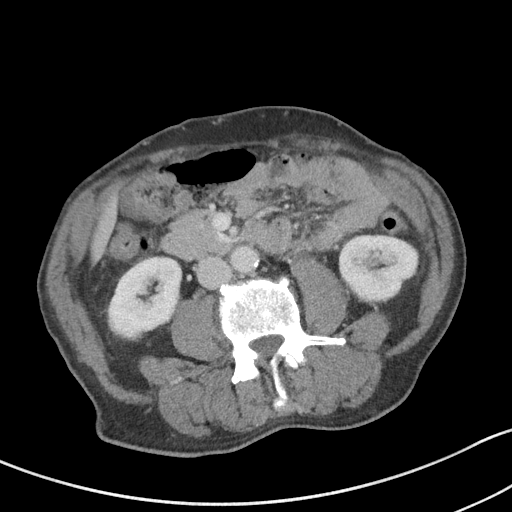
[im 58/87  soft-tissue]
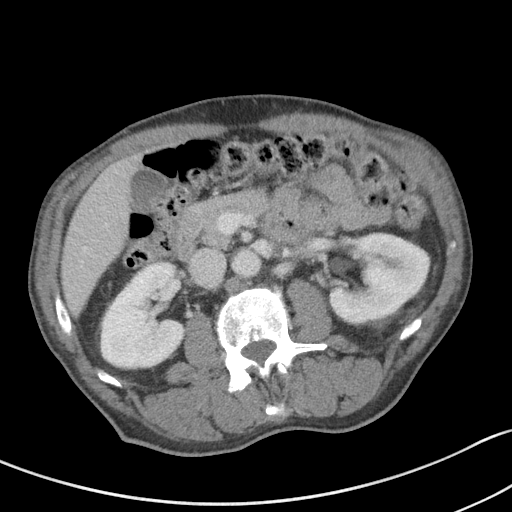
[im 58/87  bone]
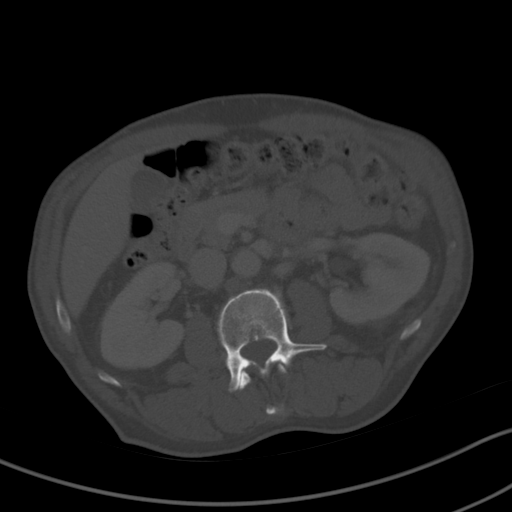
[im 69/87  soft-tissue]
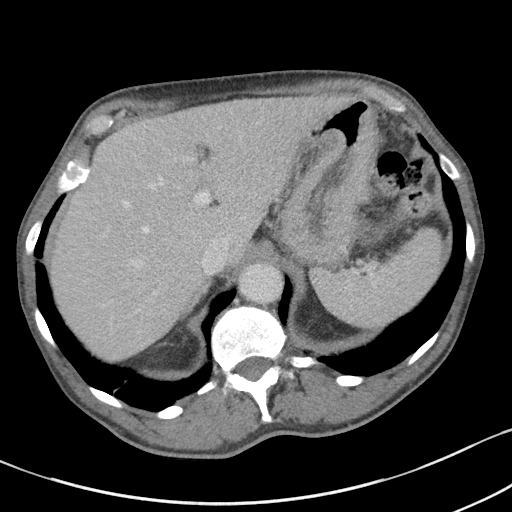
[im 75/87  soft-tissue]
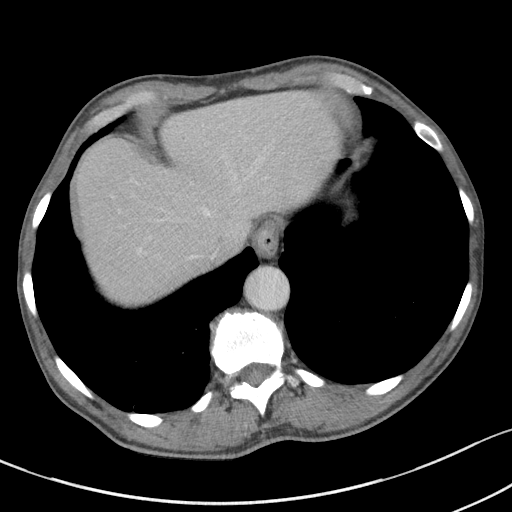
[im 81/87  soft-tissue]
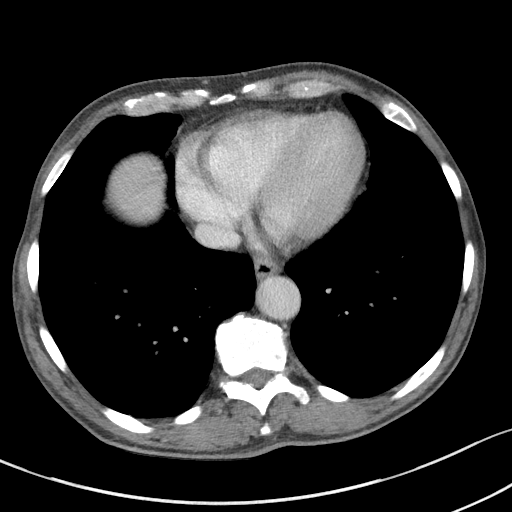

[Series 7: coronal st · coronal · 0.65mm/px · 3 of 75 slices shown]
[im 25/75  soft-tissue]
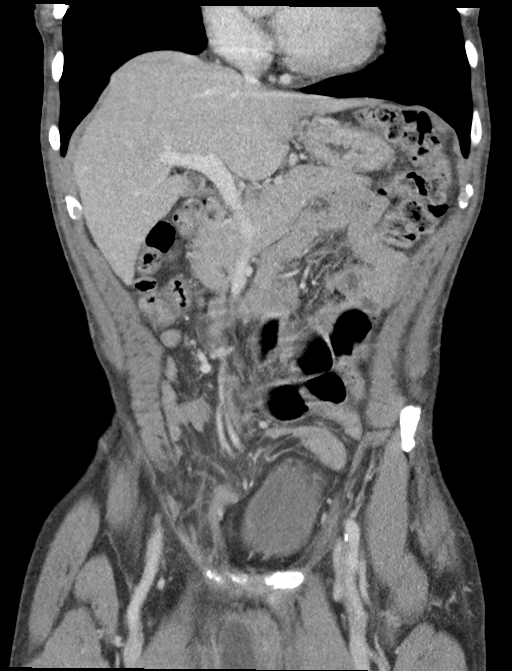
[im 33/75  soft-tissue]
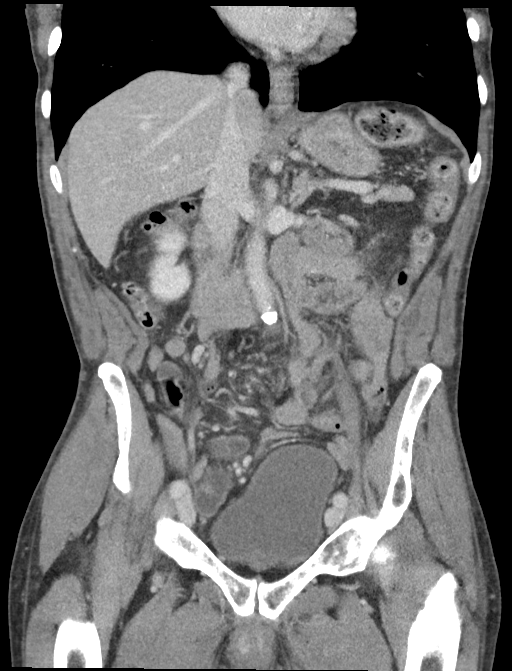
[im 42/75  soft-tissue]
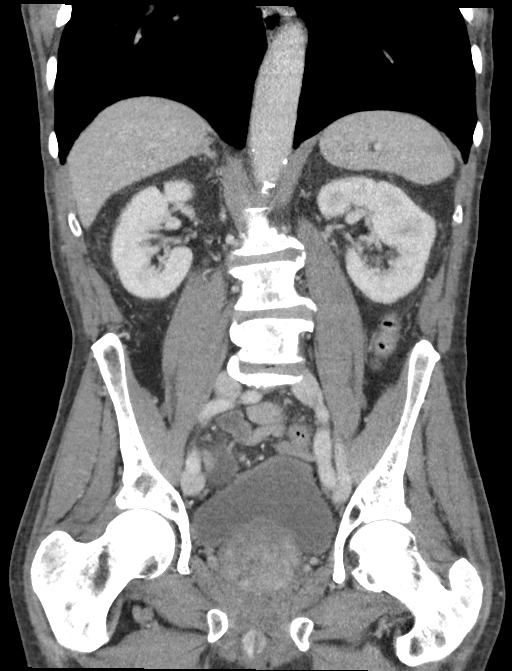

[15 of 46 positions shown; findings below may reference images not displayed]

FINDINGS: Lower chest: No pulmonary nodules or pleural effusion. No visible
pericardial effusion.

Hepatobiliary: Normal hepatic contours and density. No visible
biliary dilatation. Normal gallbladder.

Pancreas: Normal contours without ductal dilatation. No
peripancreatic fluid collection.

Spleen: Normal.

Adrenals/Urinary Tract:

--Adrenal glands: Normal right adrenal gland. 1.4 cm intermediate
attenuation left adrenal lesion.

--Right kidney/ureter: No hydronephrosis or perinephric stranding.
No nephrolithiasis. No obstructing ureteral stones.

--Left kidney/ureter: No hydronephrosis or perinephric stranding. No
nephrolithiasis. No obstructing ureteral stones.

--Urinary bladder: Unremarkable.

Stomach/Bowel:

--Stomach/Duodenum: No hiatal hernia or other gastric abnormality.
Normal duodenal course and caliber.

--Small bowel: There is dilated small bowel within a large right
inguinal hernia extending into the scrotum. Additionally, there is
swirling of the right lower quadrant mesenteric vascularity. The
dilated small bowel extends into this area.

--Colon: No focal abnormality.

--Appendix: Normal.

Vascular/Lymphatic: Twisting of the right lower quadrant mesenteric
vascularity. There is atherosclerotic calcification of the non
aneurysmal abdominal aorta. No abdominal or pelvic lymphadenopathy.

Reproductive: The prostate is enlarged, measuring 5.5 x 4.4 cm.

Musculoskeletal. Multilevel degenerative disc disease and facet
arthrosis. No bony spinal canal stenosis.

Other: None.
IMPRESSION: 1. Dilated right lower quadrant small bowel with twisting of the
right lower quadrant mesenteric vascularity is concerning for a
closed loop small bowel obstruction. No pneumatosis or free
intraperitoneal air.
2. Dilated small bowel within the right inguinal canal extends into
the scrotum and is worrisome for incarcerated inguinal hernia.
3.  Aortic Atherosclerosis ([id number]).
4. Intermediate attenuation left adrenal lesion. This could be
further characterized with multiphase adrenal protocol CT or
abdominal MRI on a nonemergent basis.
These results were called by telephone at the time of interpretation
on 09/12/2017 at [DATE] to Dr. PULLUMA MARJANAKU , who verbally
acknowledged these results.

## 2019-03-08 DIAGNOSIS — Z66 Do not resuscitate: Secondary | ICD-10-CM | POA: Insufficient documentation

## 2019-04-02 ENCOUNTER — Other Ambulatory Visit: Payer: Self-pay | Admitting: Family Medicine

## 2019-04-02 DIAGNOSIS — I82812 Embolism and thrombosis of superficial veins of left lower extremities: Secondary | ICD-10-CM

## 2019-05-09 ENCOUNTER — Ambulatory Visit: Payer: Medicare Other

## 2019-05-14 ENCOUNTER — Other Ambulatory Visit: Payer: Self-pay

## 2019-05-14 ENCOUNTER — Ambulatory Visit
Admission: RE | Admit: 2019-05-14 | Discharge: 2019-05-14 | Disposition: A | Discharge status: Home / Self Care | Payer: Medicare Other | Source: Ambulatory Visit | Attending: Family Medicine | Admitting: Family Medicine

## 2019-05-14 DIAGNOSIS — I82812 Embolism and thrombosis of superficial veins of left lower extremities: Secondary | ICD-10-CM | POA: Insufficient documentation

## 2019-06-07 ENCOUNTER — Encounter (INDEPENDENT_AMBULATORY_CARE_PROVIDER_SITE_OTHER): Payer: Self-pay | Admitting: Vascular Surgery

## 2019-06-19 ENCOUNTER — Other Ambulatory Visit: Payer: Self-pay

## 2019-06-19 ENCOUNTER — Ambulatory Visit (INDEPENDENT_AMBULATORY_CARE_PROVIDER_SITE_OTHER): Payer: Medicare Other | Admitting: Vascular Surgery

## 2019-06-19 ENCOUNTER — Encounter (INDEPENDENT_AMBULATORY_CARE_PROVIDER_SITE_OTHER): Payer: Self-pay | Admitting: Vascular Surgery

## 2019-06-19 ENCOUNTER — Encounter (INDEPENDENT_AMBULATORY_CARE_PROVIDER_SITE_OTHER): Payer: Self-pay

## 2019-06-19 VITALS — BP 137/76 | HR 92 | Resp 10 | Ht 72.0 in | Wt 160.0 lb

## 2019-06-19 DIAGNOSIS — E7800 Pure hypercholesterolemia, unspecified: Secondary | ICD-10-CM

## 2019-06-19 DIAGNOSIS — I890 Lymphedema, not elsewhere classified: Secondary | ICD-10-CM | POA: Diagnosis not present

## 2019-06-19 DIAGNOSIS — M7989 Other specified soft tissue disorders: Secondary | ICD-10-CM

## 2019-06-19 DIAGNOSIS — I10 Essential (primary) hypertension: Secondary | ICD-10-CM

## 2019-06-19 DIAGNOSIS — I82812 Embolism and thrombosis of superficial veins of left lower extremities: Secondary | ICD-10-CM | POA: Diagnosis not present

## 2019-06-19 NOTE — Assessment & Plan Note (Signed)
The patient appears to have developed lymphedema from chronic scarring and lymphatic channels.  This likely developed after his superficial thrombophlebitis.  His swelling is quite prominent.  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  We have prescribed compression stockings and recommended leg elevation and increasing activity.  He would also benefit from a lymphedema pump and we will try to get that approved in the near future as well.  At this point, a repeat reflux study is also going to be performed to reevaluate his left leg for significant reflux in the great saphenous vein or deep venous system as well.  We will see him back following the study to discuss the results and determine further treatment options.

## 2019-06-19 NOTE — Assessment & Plan Note (Signed)
blood pressure control important in reducing the progression of atherosclerotic disease. On appropriate oral medications.  

## 2019-06-19 NOTE — Assessment & Plan Note (Signed)
On appropriate therapy for this but his swelling and pain are markedly out of proportion to what would be expected from a simple superficial thrombophlebitis.  Continue anticoagulation.  Recommend compression stockings and elevation.  Repeat venous study going to be performed as well

## 2019-06-19 NOTE — Assessment & Plan Note (Signed)
lipid control important in reducing the progression of atherosclerotic disease. Continue statin therapy  

## 2019-06-19 NOTE — Patient Instructions (Signed)

## 2019-06-19 NOTE — Progress Notes (Signed)
Patient ID: Jimmy Blair, male   DOB: 01-Sep-1941, 78 y.o.   MRN: [id number]  Chief Complaint  Patient presents with  . New Patient (Initial Visit)    HPI Jimmy Blair is a 78 y.o. male.  I am asked to see the patient by Dr. Netty Starring for evaluation of left leg swelling with previously diagnosed superficial thrombophlebitis.  His leg started swelling back in January when he was found to have a left lesser saphenous vein superficial thrombophlebitis.  Despite anticoagulation, his leg continues to swell and an ultrasound last month showed persistent left lesser saphenous vein superficial thrombophlebitis.  Despite appropriate therapy, his leg swelling has continued to progress.  His right leg really has no symptoms.  His left leg is very swollen.  It is heavy and painful.  He has no chest pain or shortness of breath.  He has had no issues with anticoagulation.     Past Medical History:  Diagnosis Date  . Anemia of chronic disease 11/21/2015  . Chronic rhinitis 10/03/2017  . Essential hypertension 10/03/2017  . History of amaurosis fugax 10/03/2017  . Hypertension   . Incarcerated right inguinal hernia   . Pure hypercholesterolemia 10/03/2017  . Small bowel obstruction ([place])   . Stroke [facility])    in his eye per pts daughter [name]  . Vaccine counseling 07/29/2016    Past Surgical History:  Procedure Laterality Date  . INGUINAL HERNIA REPAIR Right 10/17/2017   Procedure: HERNIA REPAIR INGUINAL ADULT;  Surgeon: Olean Ree, MD;  Location: [facility] ORS;  Service: General;  Laterality: Right;  . INSERTION OF MESH Right 10/17/2017   Procedure: INSERTION OF MESH;  Surgeon: Olean Ree, MD;  Location: [facility] ORS;  Service: General;  Laterality: Right;  . NO PAST SURGERIES      Family History Family History  Problem Relation Age of Onset  . Alzheimer's disease Mother   . Colon cancer Brother   No bleeding or clotting disorders No aneurysms  Social History Social History   Tobacco Use   . Smoking status: Never Smoker  . Smokeless tobacco: Never Used  Substance Use Topics  . Alcohol use: Yes    Frequency: Never    Comment: 2-3beer daily  . Drug use: No    No Known Allergies  Current Outpatient Medications  Medication Sig Dispense Refill  . acetaminophen (TYLENOL) 500 MG tablet Take by mouth.    Marland Kitchen atorvastatin (LIPITOR) 20 MG tablet Take 1 tablet by mouth every morning.     . cetirizine (ZYRTEC) 10 MG tablet Take by mouth.    . furosemide (LASIX) 20 MG tablet TAKE 1 TABLET(20 MG) BY MOUTH EVERY DAY AS NEEDED FOR SWELLING    . lisinopril (PRINIVIL,ZESTRIL) 40 MG tablet Take 1 tablet by mouth every morning.     . Rivaroxaban 15 & 20 MG TBPK Take as directed on package: Start with one 15mg  tablet by mouth twice a day with food. On Day 22, switch to one 20mg  tablet once a day with food. 51 each 0   No current facility-administered medications for this visit.       REVIEW OF SYSTEMS (Negative unless checked)  Constitutional: [] Weight loss  [] Fever  [] Chills Cardiac: [] Chest pain   [] Chest pressure   [] Palpitations   [] Shortness of breath when laying flat   [] Shortness of breath at rest   [] Shortness of breath with exertion. Vascular:  [x] Pain in legs with walking   [x] Pain in legs at rest   [] Pain in legs  when laying flat   [] Claudication   [] Pain in feet when walking  [] Pain in feet at rest  [] Pain in feet when laying flat   [] History of DVT   [x] Phlebitis   [x] Swelling in legs   [] Varicose veins   [] Non-healing ulcers Pulmonary:   [] Uses home oxygen   [] Productive cough   [] Hemoptysis   [] Wheeze  [] COPD   [] Asthma Neurologic:  [] Dizziness  [] Blackouts   [] Seizures   [x] History of stroke   [x] History of TIA  [] Aphasia   [] Temporary blindness   [] Dysphagia   [] Weakness or numbness in arms   [] Weakness or numbness in legs Musculoskeletal:  [x] Arthritis   [] Joint swelling   [x] Joint pain   [] Low back pain Hematologic:  [] Easy bruising  [] Easy bleeding   [] Hypercoagulable  state   [] Anemic  [] Hepatitis Gastrointestinal:  [] Blood in stool   [] Vomiting blood  [] Gastroesophageal reflux/heartburn   [] Abdominal pain Genitourinary:  [] Chronic kidney disease   [] Difficult urination  [] Frequent urination  [] Burning with urination   [] Hematuria Skin:  [] Rashes   [] Ulcers   [] Wounds Psychological:  [] History of anxiety   []  History of major depression.     Physical Exam BP 137/76 (BP Location: Left Arm, Patient Position: Sitting, Cuff Size: Normal)   Pulse 92   Resp 10   Ht 6' (1.829 m)   Wt 160 lb (72.6 kg)   BMI 21.70 kg/m  Gen:  WD/WN, NAD.  Appears younger than stated age Head: [place]/AT, No temporalis wasting.  Ear/Nose/Throat: Hearing grossly intact, nares w/o erythema or drainage, oropharynx w/o Erythema/Exudate Eyes: Conjunctiva clear, sclera non-icteric, disconjugate gaze Neck: trachea midline.  No JVD.  Pulmonary:  Good air movement, respirations not labored, no use of accessory muscles  Cardiac: RRR, no JVD Vascular:  Vessel Right Left  Radial Palpable Palpable                          DP 1+ Trace   PT 1+ NP    Musculoskeletal: M/S 5/5 throughout.  Extremities without ischemic changes.  No deformity or atrophy.  No significant right lower extremity edema, 2-3+ left lower extremity pitting edema with stasis changes present in the left leg. Neurologic: Sensation grossly intact in extremities.  Symmetrical.  Speech is fluent. Motor exam as listed above. Psychiatric: Judgment intact, Mood & affect appropriate for pt's clinical situation. Dermatologic: No rashes or ulcers noted.  No cellulitis or open wounds.    Radiology No results found.  Labs No results found for this or any previous visit (from the past 2160 hour(s)).  Assessment/Plan:  Essential hypertension blood pressure control important in reducing the progression of atherosclerotic disease. On appropriate oral medications.   Superficial thrombosis of leg, left On appropriate  therapy for this but his swelling and pain are markedly out of proportion to what would be expected from a simple superficial thrombophlebitis.  Continue anticoagulation.  Recommend compression stockings and elevation.  Repeat venous study going to be performed as well  Pure hypercholesterolemia lipid control important in reducing the progression of atherosclerotic disease. Continue statin therapy   Lymphedema The patient appears to have developed lymphedema from chronic scarring and lymphatic channels.  This likely developed after his superficial thrombophlebitis.  His swelling is quite prominent.  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  We have prescribed compression stockings and recommended leg elevation and increasing activity.  He would also benefit from a lymphedema pump and we will  try to get that approved in the near future as well.  At this point, a repeat reflux study is also going to be performed to reevaluate his left leg for significant reflux in the great saphenous vein or deep venous system as well.  We will see him back following the study to discuss the results and determine further treatment options.  Swelling of limb The patient appears to have developed lymphedema from chronic scarring and lymphatic channels.  This likely developed after his superficial thrombophlebitis.  His swelling is quite prominent.  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  We have prescribed compression stockings and recommended leg elevation and increasing activity.  He would also benefit from a lymphedema pump and we will try to get that approved in the near future as well.  At this point, a repeat reflux study is also going to be performed to reevaluate his left leg for significant reflux in the great saphenous vein or deep venous system as well.  We will see him back following the study to discuss the results and determine further treatment options.      Leotis Pain 06/19/2019, 4:04  PM   This note was created with Dragon medical transcription system.  Any errors from dictation are unintentional.

## 2019-06-27 ENCOUNTER — Ambulatory Visit (INDEPENDENT_AMBULATORY_CARE_PROVIDER_SITE_OTHER): Payer: Medicare Other | Admitting: Nurse Practitioner

## 2019-06-27 ENCOUNTER — Encounter (INDEPENDENT_AMBULATORY_CARE_PROVIDER_SITE_OTHER): Payer: Medicare Other

## 2019-07-19 ENCOUNTER — Ambulatory Visit (INDEPENDENT_AMBULATORY_CARE_PROVIDER_SITE_OTHER): Payer: Medicare Other

## 2019-07-19 ENCOUNTER — Other Ambulatory Visit: Payer: Self-pay

## 2019-07-19 ENCOUNTER — Ambulatory Visit (INDEPENDENT_AMBULATORY_CARE_PROVIDER_SITE_OTHER): Payer: Medicare Other | Admitting: Nurse Practitioner

## 2019-07-19 ENCOUNTER — Encounter (INDEPENDENT_AMBULATORY_CARE_PROVIDER_SITE_OTHER): Payer: Self-pay | Admitting: Nurse Practitioner

## 2019-07-19 ENCOUNTER — Encounter (INDEPENDENT_AMBULATORY_CARE_PROVIDER_SITE_OTHER): Payer: Self-pay

## 2019-07-19 VITALS — BP 158/77 | HR 74 | Resp 16 | Wt 163.4 lb

## 2019-07-19 DIAGNOSIS — M7989 Other specified soft tissue disorders: Secondary | ICD-10-CM

## 2019-07-19 DIAGNOSIS — R600 Localized edema: Secondary | ICD-10-CM

## 2019-07-19 DIAGNOSIS — I82812 Embolism and thrombosis of superficial veins of left lower extremities: Secondary | ICD-10-CM | POA: Diagnosis not present

## 2019-07-19 DIAGNOSIS — I890 Lymphedema, not elsewhere classified: Secondary | ICD-10-CM

## 2019-07-19 DIAGNOSIS — I10 Essential (primary) hypertension: Secondary | ICD-10-CM

## 2019-07-20 ENCOUNTER — Encounter (INDEPENDENT_AMBULATORY_CARE_PROVIDER_SITE_OTHER): Payer: Self-pay | Admitting: Nurse Practitioner

## 2019-07-20 ENCOUNTER — Telehealth (INDEPENDENT_AMBULATORY_CARE_PROVIDER_SITE_OTHER): Payer: Self-pay

## 2019-07-20 NOTE — Telephone Encounter (Signed)
Home health orders for left unna boot has been faxed to [facility].The patient has Risk analyst Lanelle Bal) coming in the home but they do not do skilled nursing only pcs services and stated the both home health nurses could not be in the home at the same time due to insurance but the patient can use both.

## 2019-07-20 NOTE — Progress Notes (Signed)
SUBJECTIVE:  Patient ID: Jimmy Blair, male    DOB: 09-Jan-1941, 78 y.o.   MRN: [id number] Chief Complaint  Patient presents with  . Follow-up    ultrasound follow up    HPI  Jimmy Blair is a 78 y.o. male that presents with evaluation of left leg swelling following diagnosis of superficial thrombophlebitis.  The swelling all began back in January when he was found to have a thrombus within the left small saphenous vein.  The patient was also placed on anticoagulation however his leg has continued to swell.  An outside ultrasound last month showed persistent small saphenous vein superficial thrombophlebitis.  The patient's daughters have been helping the patient with keeping compression socks on since January as well as working with elevation and exercise however this is not helped his swelling, in fact it is continue to progress.  There are generally no issues with his right lower extremity.  His leg is very heavy and painful he has begun to have blisters as well as some early pre-ulcerative changes.  He denies any chest pain or shortness of breath.  He denies any TIA-like symptoms.  Noninvasive studies today showed no evidence of DVT within the left lower extremity.  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  There was no evidence of chronic venous insufficiency.  Past Medical History:  Diagnosis Date  . Anemia of chronic disease 11/21/2015  . Chronic rhinitis 10/03/2017  . Essential hypertension 10/03/2017  . History of amaurosis fugax 10/03/2017  . Hypertension   . Incarcerated right inguinal hernia   . Pure hypercholesterolemia 10/03/2017  . Small bowel obstruction ([place])   . 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 Vaccine counseling 07/29/2016    Past Surgical History:  Procedure Laterality Date  . INGUINAL HERNIA REPAIR Right 10/17/2017   Procedure: HERNIA REPAIR INGUINAL ADULT;  Surgeon: Olean Ree, MD;   Location: [facility] ORS;  Service: General;  Laterality: Right;  . INSERTION OF MESH Right 10/17/2017   Procedure: INSERTION OF MESH;  Surgeon: Olean Ree, MD;  Location: [facility] ORS;  Service: General;  Laterality: Right;  . NO PAST SURGERIES      Social History   Socioeconomic History  . Marital status: Divorced    Spouse name: Not on file  . Number of children: Not on file  . Years of education: Not on file  . Highest education level: Not on file  Occupational History  . Not on file  Social Needs  . Financial resource strain: Not on file  . Food insecurity    Worry: Not on file    Inability: Not on file  . Transportation needs    Medical: Not on file    Non-medical: Not on file  Tobacco Use  . Smoking status: Never Smoker  . Smokeless tobacco: Never Used  Substance and Sexual Activity  . Alcohol use: Yes    Frequency: Never    Comment: 2-3beer daily  . Drug use: No  . Sexual activity: Never  Lifestyle  . Physical activity    Days per week: Not on file    Minutes per session: Not on file  . Stress: Not on file  Relationships  . Social Herbalist on phone: Not on file    Gets together: Not on file    Attends religious service: Not on file    Active member of club or organization: Not  on file    Attends meetings of clubs or organizations: Not on file    Relationship status: Not on file  . Intimate partner violence    Fear of current or ex partner: Not on file    Emotionally abused: Not on file    Physically abused: Not on file    Forced sexual activity: Not on file  Other Topics Concern  . Not on file  Social History Narrative  . Not on file    Family History  Problem Relation Age of Onset  . Alzheimer's disease Mother   . Colon cancer Brother     No Known Allergies   Review of Systems   Review of Systems: Negative Unless Checked Constitutional: [] Weight loss  [] Fever  [] Chills Cardiac: [] Chest pain   []  Atrial Fibrillation  [] Palpitations    [] Shortness of breath when laying flat   [] Shortness of breath with exertion. [] Shortness of breath at rest Vascular:  [] Pain in legs with walking   [] Pain in legs with standing [] Pain in legs when laying flat   [] Claudication    [] Pain in feet when laying flat    [] History of DVT   [] Phlebitis   [x] Swelling in legs   [] Varicose veins   [] Non-healing ulcers Pulmonary:   [] Uses home oxygen   [] Productive cough   [] Hemoptysis   [] Wheeze  [] COPD   [] Asthma Neurologic:  [] Dizziness   [] Seizures  [] Blackouts [x] History of stroke   [] History of TIA  [] Aphasia   [] Temporary Blindness   [] Weakness or numbness in arm   [x] Weakness or numbness in leg Musculoskeletal:   [] Joint swelling   [] Joint pain   [] Low back pain  []  History of Knee Replacement [] Arthritis [] back Surgeries  []  Spinal Stenosis    Hematologic:  [] Easy bruising  [] Easy bleeding   [] Hypercoagulable state   [] Anemic Gastrointestinal:  [] Diarrhea   [] Vomiting  [] Gastroesophageal reflux/heartburn   [] Difficulty swallowing. [] Abdominal pain Genitourinary:  [] Chronic kidney disease   [] Difficult urination  [] Anuric   [] Blood in urine [] Frequent urination  [] Burning with urination   [] Hematuria Skin:  [] Rashes   [] Ulcers [] Wounds Psychological:  [] History of anxiety   []  History of major depression  [x]  Memory Difficulties      OBJECTIVE:   Physical Exam  BP (!) 158/77 (BP Location: Right Arm)   Pulse 74   Resp 16   Wt 163 lb 6.4 oz (74.1 kg)   BMI 22.16 kg/m   Gen: WD/WN, NAD Head: [place]/AT, No temporalis wasting.  Ear/Nose/Throat: Hearing grossly intact, nares w/o erythema or drainage Eyes: PER, EOMI, sclera nonicteric.  Neck: Supple, no masses.  No JVD.  Pulmonary:  Good air movement, no use of accessory muscles.  Cardiac: RRR Vascular:  Unable to palpate left pulse due to edema, 4+ hard edema Vessel Right Left  Radial Palpable Palpable   Gastrointestinal: soft, non-distended. No guarding/no peritoneal signs.  Musculoskeletal:  M/S 5/5 throughout.  No deformity or atrophy.  Neurologic: Pain and light touch intact in extremities.  Symmetrical.  Speech is fluent. Motor exam as listed above. Psychiatric: Judgment intact, Mood & affect appropriate for pt's clinical situation. Dermatologic: No Venous rashes.  Blisters forming on surface. No changes consistent with cellulitis. Lymph : No Cervical lymphadenopathy, dermal thickening left lower extremity       ASSESSMENT AND PLAN:  1. Lymphedema Today we will place the patient in a Unna wrap in order to help gain control of the patient's swelling.  At this point due to  the extensive swelling compression socks may not be effective especially with the patient forming blisters on his leg.  The superficial thrombophlebitis because from small saphenous vein may have precipitated the swelling however it is likely that the patient has lymphedema contributing to the cause of his swelling.  There is likely chronic swelling of the lymph tracts.  The patient has been wearing medical grade 1 compression stockings since January as well as using elevation and exercise.  I believe that at this time the addition of a lymphedema pump would also be very positive factor in his care.  The patient and his family are instructed to utilize it twice per day once in the morning once in the evening in addition to continue with conservative therapy as outlined above.  We will have the patient return in 4 weeks in order to evaluate progress of swelling.  2. Essential hypertension Continue antihypertensive medications as already ordered, these medications have been reviewed and there are no changes at this time.   3. Superficial thrombosis of leg, left We will continue with conservative therapy as outlined above   Current Outpatient Medications on File Prior to Visit  Medication Sig Dispense Refill  . acetaminophen (TYLENOL) 500 MG tablet Take by mouth.    Marland Kitchen atorvastatin (LIPITOR) 20 MG tablet Take 1  tablet by mouth every morning.     . cetirizine (ZYRTEC) 10 MG tablet Take by mouth.    . furosemide (LASIX) 20 MG tablet TAKE 1 TABLET(20 MG) BY MOUTH EVERY DAY AS NEEDED FOR SWELLING    . lisinopril (PRINIVIL,ZESTRIL) 40 MG tablet Take 1 tablet by mouth every morning.     . Rivaroxaban 15 & 20 MG TBPK Take as directed on package: Start with one 15mg  tablet by mouth twice a day with food. On Day 22, switch to one 20mg  tablet once a day with food. 51 each 0   No current facility-administered medications on file prior to visit.     There are no Patient Instructions on file for this visit. No follow-ups on file.   Kris Hartmann, NP  This note was completed with Sales executive.  Any errors are purely unintentional.

## 2019-07-26 ENCOUNTER — Telehealth (INDEPENDENT_AMBULATORY_CARE_PROVIDER_SITE_OTHER): Payer: Self-pay

## 2019-07-26 NOTE — Telephone Encounter (Signed)
Home health called requesting verbal orders to continue unna boots for 9 weeks for skilled nursing.I spoke with Eulogio Ditch NP she approved the verbal order and the nurse has been made aware.

## 2019-07-27 ENCOUNTER — Encounter (INDEPENDENT_AMBULATORY_CARE_PROVIDER_SITE_OTHER): Payer: Self-pay

## 2019-08-06 ENCOUNTER — Telehealth (INDEPENDENT_AMBULATORY_CARE_PROVIDER_SITE_OTHER): Payer: Self-pay | Admitting: Nurse Practitioner

## 2019-08-06 NOTE — Telephone Encounter (Signed)
Spoke with Daughter and advised her of the below and she expressed concern stating she had home health coming to patient house 4 hrs per day and that she didn't think he needed more care. I advised [name] to contact Mardene Celeste and speak with her concerning that. AS, CMA

## 2019-08-06 NOTE — Telephone Encounter (Signed)
Mardene Celeste with [facility] [phone] calling stating that patient is not leaving his unna boots on.  Also requesting verbal order for PT and Medical Social work.  Please advise. AS, CMA

## 2019-08-06 NOTE — Telephone Encounter (Signed)
I spoke with Mardene Celeste and advised her of the below. She said that she was concerned for the patient and that she didn't think he was mentally capable of staying home alone anymore and that he had a helper that came to the house 3 hours a day but that didn't seem to help him much.  I have reached out to Robin-Patient daughter and left message requesting a return call to discuss this. AS< CMA

## 2019-08-06 NOTE — Telephone Encounter (Signed)
Unfortunately we can't make the patient keep his boots on.  Perhaps we can contact the family to try to help compliance.  That is fine for verbal orders

## 2019-08-08 ENCOUNTER — Telehealth (INDEPENDENT_AMBULATORY_CARE_PROVIDER_SITE_OTHER): Payer: Self-pay | Admitting: Nurse Practitioner

## 2019-08-08 NOTE — Telephone Encounter (Signed)
Left message on secure line with verbal orders. AS, CMA

## 2019-08-08 NOTE — Telephone Encounter (Signed)
That is fine 

## 2019-08-08 NOTE — Telephone Encounter (Signed)
Merry Proud with [facility] [phone] calling requesting verbal order for PT 1 x week for 1 week then 2 x week for 2 weeks.  Please advise. AS, CMA

## 2019-08-16 ENCOUNTER — Encounter (INDEPENDENT_AMBULATORY_CARE_PROVIDER_SITE_OTHER): Payer: Self-pay | Admitting: Nurse Practitioner

## 2019-08-16 ENCOUNTER — Other Ambulatory Visit: Payer: Self-pay

## 2019-08-16 ENCOUNTER — Ambulatory Visit (INDEPENDENT_AMBULATORY_CARE_PROVIDER_SITE_OTHER): Payer: Medicare Other | Admitting: Nurse Practitioner

## 2019-08-16 VITALS — BP 154/77 | HR 75 | Resp 14 | Ht 72.0 in | Wt 161.0 lb

## 2019-08-16 DIAGNOSIS — I890 Lymphedema, not elsewhere classified: Secondary | ICD-10-CM

## 2019-08-16 DIAGNOSIS — I10 Essential (primary) hypertension: Secondary | ICD-10-CM | POA: Diagnosis not present

## 2019-08-16 DIAGNOSIS — E7800 Pure hypercholesterolemia, unspecified: Secondary | ICD-10-CM | POA: Diagnosis not present

## 2019-08-17 ENCOUNTER — Encounter (INDEPENDENT_AMBULATORY_CARE_PROVIDER_SITE_OTHER): Payer: Self-pay | Admitting: Nurse Practitioner

## 2019-08-17 NOTE — Progress Notes (Signed)
SUBJECTIVE:  Patient ID: Jimmy Blair, male    DOB: 01/19/1941, 78 y.o.   MRN: [id number] Chief Complaint  Patient presents with  . Follow-up    HPI  Jimmy Blair is a 78 y.o. male that presents today for Unna wrap follow-up.  The patient has been in [place] wraps for the last 5 weeks.  The patient has been getting his Unna wraps placed by home health.  On one occasion the Unna wrap had to be replaced due to the patient removing it.  The patient stated that the nurse placed the Unna wrap too tight.  His swelling has shown slight improvement.  He denies any fever, chills, nausea, vomiting or diarrhea.  He denies any chest pain or shortness of breath.  The patient also states that the wraps feel better to him and allows him to ambulate somewhat better.  Past Medical History:  Diagnosis Date  . Anemia of chronic disease 11/21/2015  . Chronic rhinitis 10/03/2017  . Essential hypertension 10/03/2017  . History of amaurosis fugax 10/03/2017  . Hypertension   . Incarcerated right inguinal hernia   . Pure hypercholesterolemia 10/03/2017  . Small bowel obstruction ([place])   . Stroke [facility])    in his eye per pts daughter [name]  . Vaccine counseling 07/29/2016    Past Surgical History:  Procedure Laterality Date  . INGUINAL HERNIA REPAIR Right 10/17/2017   Procedure: HERNIA REPAIR INGUINAL ADULT;  Surgeon: Olean Ree, MD;  Location: [facility] ORS;  Service: General;  Laterality: Right;  . INSERTION OF MESH Right 10/17/2017   Procedure: INSERTION OF MESH;  Surgeon: Olean Ree, MD;  Location: [facility] ORS;  Service: General;  Laterality: Right;  . NO PAST SURGERIES      Social History   Socioeconomic History  . Marital status: Divorced    Spouse name: Not on file  . Number of children: Not on file  . Years of education: Not on file  . Highest education level: Not on file  Occupational History  . Not on file  Social Needs  . Financial resource strain: Not on file  . Food insecurity   Worry: Not on file    Inability: Not on file  . Transportation needs    Medical: Not on file    Non-medical: Not on file  Tobacco Use  . Smoking status: Never Smoker  . Smokeless tobacco: Never Used  Substance and Sexual Activity  . Alcohol use: Yes    Frequency: Never    Comment: 2-3beer daily  . Drug use: No  . Sexual activity: Never  Lifestyle  . Physical activity    Days per week: Not on file    Minutes per session: Not on file  . Stress: Not on file  Relationships  . Social Herbalist on phone: Not on file    Gets together: Not on file    Attends religious service: Not on file    Active member of club or organization: Not on file    Attends meetings of clubs or organizations: Not on file    Relationship status: Not on file  . Intimate partner violence    Fear of current or ex partner: Not on file    Emotionally abused: Not on file    Physically abused: Not on file    Forced sexual activity: Not on file  Other Topics Concern  . Not on file  Social History Narrative  . Not on file    Family  History  Problem Relation Age of Onset  . Alzheimer's disease Mother   . Colon cancer Brother     No Known Allergies   Review of Systems   Review of Systems: Negative Unless Checked Constitutional: [] Weight loss  [] Fever  [] Chills Cardiac: [] Chest pain   []  Atrial Fibrillation  [] Palpitations   [] Shortness of breath when laying flat   [] Shortness of breath with exertion. [] Shortness of breath at rest Vascular:  [] Pain in legs with walking   [] Pain in legs with standing [] Pain in legs when laying flat   [] Claudication    [] Pain in feet when laying flat    [] History of DVT   [] Phlebitis   [x] Swelling in legs   [] Varicose veins   [] Non-healing ulcers Pulmonary:   [] Uses home oxygen   [] Productive cough   [] Hemoptysis   [] Wheeze  [] COPD   [] Asthma Neurologic:  [] Dizziness   [] Seizures  [] Blackouts [x] History of stroke   [] History of TIA  [] Aphasia   [] Temporary  Blindness   [] Weakness or numbness in arm   [x] Weakness or numbness in leg Musculoskeletal:   [] Joint swelling   [] Joint pain   [] Low back pain  []  History of Knee Replacement [] Arthritis [] back Surgeries  []  Spinal Stenosis    Hematologic:  [] Easy bruising  [] Easy bleeding   [] Hypercoagulable state   [] Anemic Gastrointestinal:  [] Diarrhea   [] Vomiting  [] Gastroesophageal reflux/heartburn   [] Difficulty swallowing. [] Abdominal pain Genitourinary:  [] Chronic kidney disease   [] Difficult urination  [] Anuric   [] Blood in urine [] Frequent urination  [] Burning with urination   [] Hematuria Skin:  [] Rashes   [] Ulcers [] Wounds Psychological:  [] History of anxiety   []  History of major depression  [x]  Memory Difficulties      OBJECTIVE:   Physical Exam  BP (!) 154/77 (BP Location: Left Arm, Patient Position: Sitting, Cuff Size: Normal)   Pulse 75   Resp 14   Ht 6' (1.829 m)   Wt 161 lb (73 kg)   BMI 21.84 kg/m   Gen: WD/WN, NAD Head: [place]/AT, No temporalis wasting.  Ear/Nose/Throat: Hearing grossly intact, nares w/o erythema or drainage Eyes: PER, EOMI, sclera nonicteric.  Neck: Supple, no masses.  No JVD.  Pulmonary:  Good air movement, no use of accessory muscles.  Cardiac: RRR Vascular:  2+ edema right, 4+ left nonpitting.  Pedal pulses hard to palpate due to edema Vessel Right Left  Radial Palpable Palpable   Gastrointestinal: soft, non-distended. No guarding/no peritoneal signs.  Musculoskeletal: M/S 5/5 throughout.  No deformity or atrophy.  Neurologic: Pain and light touch intact in extremities.  Symmetrical.  Speech is fluent. Motor exam as listed above. Psychiatric: Judgment intact, Mood & affect appropriate for pt's clinical situation. Dermatologic: No Venous rashes. No Ulcers Noted.  No changes consistent with cellulitis. Lymph : No Cervical lymphadenopathy, dermal thickening with some lichenification bilaterally       ASSESSMENT AND PLAN:  1. Lymphedema Recommend: We  will continue to place the patient in [place] wraps to be changed on a weekly basis.  An Unna wrap will be placed today with home health continue to do wraps at home.  I stressed the importance of keeping his Unna wrap onto the patient.  I also instructed him to be sure to let the nurse know if it is too tight versus taking it off.  I have reviewed my previous discussion with the patient regarding swelling and why it causes symptoms.  Patient will continue wearing graduated compression stockings class 1 (20-30  mmHg) on a daily basis once he is out of Unna wraps.  The patient will  beginning wearing the stockings first thing in the morning and removing them in the evening. The patient is instructed specifically not to sleep in the stockings.    In addition, behavioral modification including several periods of elevation of the lower extremities during the day will be continued.  This was reviewed with the patient during the initial visit.  The patient will also continue routine exercise, especially walking on a daily basis as was discussed during the initial visit.    Despite conservative treatments including graduated compression therapy class 1 and behavioral modification including exercise and elevation the patient  has not obtained adequate control of the lymphedema.  The patient still has stage 3 lymphedema and therefore, I believe that a lymph pump should be added to improve the control of the patient's lymphedema.  Additionally, a lymph pump is warranted because it will reduce the risk of cellulitis and ulceration in the future.  Patient should follow-up in six months    2. Essential hypertension Continue antihypertensive medications as already ordered, these medications have been reviewed and there are no changes at this time.   3. Pure hypercholesterolemia Continue statin as ordered and reviewed, no changes at this time    Current Outpatient Medications on File Prior to Visit  Medication  Sig Dispense Refill  . acetaminophen (TYLENOL) 500 MG tablet Take by mouth.    Marland Kitchen atorvastatin (LIPITOR) 20 MG tablet Take 1 tablet by mouth every morning.     . cetirizine (ZYRTEC) 10 MG tablet Take by mouth.    . furosemide (LASIX) 20 MG tablet TAKE 1 TABLET(20 MG) BY MOUTH EVERY DAY AS NEEDED FOR SWELLING    . lisinopril (PRINIVIL,ZESTRIL) 40 MG tablet Take 1 tablet by mouth every morning.     . Rivaroxaban 15 & 20 MG TBPK Take as directed on package: Start with one 15mg  tablet by mouth twice a day with food. On Day 22, switch to one 20mg  tablet once a day with food. 51 each 0   No current facility-administered medications on file prior to visit.     There are no Patient Instructions on file for this visit. No follow-ups on file.   Kris Hartmann, NP  This note was completed with Sales executive.  Any errors are purely unintentional.

## 2019-08-23 ENCOUNTER — Encounter (INDEPENDENT_AMBULATORY_CARE_PROVIDER_SITE_OTHER): Payer: Medicare Other

## 2019-08-30 ENCOUNTER — Encounter (INDEPENDENT_AMBULATORY_CARE_PROVIDER_SITE_OTHER): Payer: Medicare Other

## 2019-09-06 ENCOUNTER — Ambulatory Visit (INDEPENDENT_AMBULATORY_CARE_PROVIDER_SITE_OTHER): Payer: Medicare Other | Admitting: Nurse Practitioner

## 2019-09-13 ENCOUNTER — Ambulatory Visit (INDEPENDENT_AMBULATORY_CARE_PROVIDER_SITE_OTHER): Payer: Medicare Other | Admitting: Nurse Practitioner

## 2019-11-01 ENCOUNTER — Emergency Department
Admission: EM | Admit: 2019-11-01 | Discharge: 2019-11-01 | Disposition: A | Discharge status: Home / Self Care | Payer: Medicare Other | Attending: Student in an Organized Health Care Education/Training Program | Admitting: Student in an Organized Health Care Education/Training Program

## 2019-11-01 ENCOUNTER — Encounter: Payer: Self-pay | Admitting: Emergency Medicine

## 2019-11-01 ENCOUNTER — Other Ambulatory Visit: Payer: Self-pay

## 2019-11-01 ENCOUNTER — Emergency Department: Payer: Medicare Other

## 2019-11-01 DIAGNOSIS — Z79899 Other long term (current) drug therapy: Secondary | ICD-10-CM | POA: Insufficient documentation

## 2019-11-01 DIAGNOSIS — I890 Lymphedema, not elsewhere classified: Secondary | ICD-10-CM | POA: Diagnosis not present

## 2019-11-01 DIAGNOSIS — I10 Essential (primary) hypertension: Secondary | ICD-10-CM | POA: Diagnosis not present

## 2019-11-01 DIAGNOSIS — M79605 Pain in left leg: Secondary | ICD-10-CM | POA: Diagnosis present

## 2019-11-01 HISTORY — DX: Acute embolism and thrombosis of unspecified deep veins of unspecified lower extremity: I82.409

## 2019-11-01 NOTE — ED Notes (Signed)
See triage note  Presents with pain and swelling to left lower leg  States he noticed more pain and swelling about 1 week ago  No injury  Hx of DVT

## 2019-11-01 NOTE — ED Notes (Signed)
Patient transported to Ultrasound 

## 2019-11-01 NOTE — ED Provider Notes (Signed)
[facility] Emergency Department Provider Note    First MD Initiated Contact with Patient 11/01/19 1805     (approximate)  I have reviewed the triage vital signs and the nursing notes.   HISTORY  Chief Complaint Leg Pain    HPI Jimmy Blair is a 78 y.o. male the history of chronic lymphedema presents the ER for evaluation of left leg swelling.  Patient is on Xarelto for history of DVT has been compliant with his medications.  Has fortunately not been compliant with his Unna boot and compression stockings.  Has been seen by vascular surgery as well as podiatry.  Family states that they stopped giving him his diuretic because he was having to use the restroom too frequently.  He denies any chest pain or shortness of breath.  No fevers.  States he does not like wearing the compression stockings because they cause his leg to ache but agrees to do so if needed.    Past Medical History:  Diagnosis Date  . Anemia of chronic disease 11/21/2015  . Chronic rhinitis 10/03/2017  . DVT (deep venous thrombosis) (Jimmy Blair)   . Essential hypertension 10/03/2017  . History of amaurosis fugax 10/03/2017  . Hypertension   . Incarcerated right inguinal hernia   . Pure hypercholesterolemia 10/03/2017  . Small bowel obstruction (Jimmy Blair)   . Stroke [facility]    in his eye per pts daughter Jimmy Blair  . Vaccine counseling 07/29/2016   Family History  Problem Relation Age of Onset  . Alzheimer's disease Mother   . Colon cancer Brother    Past Surgical History:  Procedure Laterality Date  . INGUINAL HERNIA REPAIR Right 10/17/2017   Procedure: HERNIA REPAIR INGUINAL ADULT;  Surgeon: Jimmy Ree, MD;  Location: [facility] ORS;  Service: General;  Laterality: Right;  . INSERTION OF MESH Right 10/17/2017   Procedure: INSERTION OF MESH;  Surgeon: Jimmy Ree, MD;  Location: [facility] ORS;  Service: General;  Laterality: Right;  . NO PAST SURGERIES     Patient Active Problem List   Diagnosis Date  Noted  . Lymphedema 06/19/2019  . Swelling of limb 06/19/2019  . DNR (do not resuscitate) 03/08/2019  . Superficial thrombosis of leg, left 12/07/2018  . Incarcerated inguinal hernia, unilateral 10/16/2017  . Incarcerated inguinal hernia   . Non-recurrent unilateral inguinal hernia with obstruction without gangrene   . Chronic rhinitis 10/03/2017  . Essential hypertension 10/03/2017  . History of amaurosis fugax 10/03/2017  . Pure hypercholesterolemia 10/03/2017  . Incarcerated right inguinal hernia   . Small bowel obstruction (Jimmy Blair)   . Vaccine counseling 07/29/2016  . Anemia of chronic disease 11/21/2015      Prior to Admission medications   Medication Sig Start Date End Date Taking? Authorizing Provider  acetaminophen (TYLENOL) 500 MG tablet Take by mouth.    [provider]  atorvastatin (LIPITOR) 20 MG tablet Take 1 tablet by mouth every morning.  05/09/17   [provider]  cetirizine (ZYRTEC) 10 MG tablet Take by mouth.    [provider]  furosemide (LASIX) 20 MG tablet TAKE 1 TABLET(20 MG) BY MOUTH EVERY DAY AS NEEDED FOR SWELLING 04/03/19   [provider]  lisinopril (PRINIVIL,ZESTRIL) 40 MG tablet Take 1 tablet by mouth every morning.  07/13/17   [provider]  Rivaroxaban 15 & 20 MG TBPK Take as directed on package: Start with one 15mg  tablet by mouth twice a day with food. On Day 22, switch to one 20mg  tablet once  a day with food. 11/27/18   Jimmy Drafts, MD    Allergies Patient has no known allergies.    Social History Social History   Tobacco Use  . Smoking status: Never Smoker  . Smokeless tobacco: Never Used  Substance Use Topics  . Alcohol use: Yes    Comment: 2-3beer daily  . Drug use: No    Review of Systems Patient denies headaches, rhinorrhea, blurry vision, numbness, shortness of breath, chest pain, edema, cough, abdominal pain, nausea, vomiting, diarrhea, dysuria, fevers, rashes or hallucinations unless  otherwise stated above in HPI. ____________________________________________   PHYSICAL EXAM:  VITAL SIGNS: Vitals:   11/01/19 1555  BP: 120/61  Pulse: 86  Resp: 16  Temp: 98.5 F (36.9 C)  SpO2: 97%    Constitutional: Alert and oriented.  Eyes: Conjunctivae are normal.  Head: Atraumatic. Nose: No congestion/rhinnorhea. Mouth/Throat: Mucous membranes are moist.   Neck: No stridor. Painless ROM.  Cardiovascular: Normal rate, regular rhythm. Grossly normal heart sounds.  Good peripheral circulation. Respiratory: Normal respiratory effort.  No retractions. Lungs CTAB. Gastrointestinal: Soft and nontender. No distention. No abdominal bruits. No CVA tenderness. Genitourinary:  Musculoskeletal: No lower extremity tenderness  3+ lle edema.  No joint effusions. Neurologic:  Normal speech and language. No gross focal neurologic deficits are appreciated. No facial droop Skin:  Skin is warm, dry and intact. No rash noted. Psychiatric: Mood and affect are normal. Speech and behavior are normal.  ____________________________________________   LABS (all labs ordered are listed, but only abnormal results are displayed)  No results found for this or any previous visit (from the past 24 hour(s)). ____________________________________________ ____________________________________________  Jimmy Blair  I personally reviewed all radiographic images ordered to evaluate for the above acute complaints and reviewed radiology reports and findings.  These findings were personally discussed with the patient.  Please see medical record for radiology report.  ____________________________________________   PROCEDURES  Procedure(s) performed:  Procedures    Critical Care performed: no ____________________________________________   INITIAL IMPRESSION / ASSESSMENT AND PLAN / ED COURSE  Pertinent labs & imaging results that were available during my care of the patient were reviewed by me and  considered in my medical decision making (see chart for details).   DDX: dvt, lymphedema, cellulitis,   Jimmy Blair is a 78 y.o. who presents to the ED with symptoms as described above.  Patient well-appearing and nontoxic.  Does have lower extremity edema that appears chronic in nature secondary to his chronic lymphedema.  Ultrasound does not show any evidence of DVT the patient already on Xarelto.  Does not appear to have any infectious process or cellulitic changes at this time.  Discussed importance of compression stockings and follow-up with vascular surgery.  Have discussed with the patient and available family all diagnostics and treatments performed thus far and all questions were answered to the best of my ability. The patient demonstrates understanding and agreement with plan.     The patient was evaluated in Emergency Department today for the symptoms described in the history of present illness. He/she was evaluated in the context of the global COVID-19 pandemic, which necessitated consideration that the patient might be at risk for infection with the SARS-CoV-2 virus that causes COVID-19. Institutional protocols and algorithms that pertain to the evaluation of patients at risk for COVID-19 are in a state of rapid change based on information released by regulatory bodies including the CDC and federal and state organizations. These policies and algorithms were followed during the patient's care  in the ED.  As part of my medical decision making, I reviewed the following data within the [place] notes reviewed and incorporated, Labs reviewed, notes from prior ED visits and [place] Controlled Substance Database   ____________________________________________   FINAL CLINICAL IMPRESSION(S) / ED DIAGNOSES  Final diagnoses:  Lymphedema of left leg      NEW MEDICATIONS STARTED DURING THIS VISIT:  New Prescriptions   No medications on file     Note:  This  document was prepared using Dragon voice recognition software and may include unintentional dictation errors.    Merlyn Lot, MD 11/01/19 [phone]

## 2019-11-01 NOTE — ED Triage Notes (Signed)
Here for left leg pain and swelling. Has known dvt, on xarelto but swelling increasing.  Has been using compression hose as well.

## 2019-11-20 ENCOUNTER — Ambulatory Visit (INDEPENDENT_AMBULATORY_CARE_PROVIDER_SITE_OTHER): Payer: Medicare Other | Admitting: Nurse Practitioner

## 2019-11-20 ENCOUNTER — Encounter (INDEPENDENT_AMBULATORY_CARE_PROVIDER_SITE_OTHER): Payer: Self-pay | Admitting: Nurse Practitioner

## 2019-11-20 ENCOUNTER — Other Ambulatory Visit: Payer: Self-pay

## 2019-11-20 VITALS — BP 129/66 | HR 74 | Resp 15

## 2019-11-20 DIAGNOSIS — I890 Lymphedema, not elsewhere classified: Secondary | ICD-10-CM | POA: Diagnosis not present

## 2019-11-20 DIAGNOSIS — R4189 Other symptoms and signs involving cognitive functions and awareness: Secondary | ICD-10-CM | POA: Diagnosis not present

## 2019-11-20 DIAGNOSIS — E7800 Pure hypercholesterolemia, unspecified: Secondary | ICD-10-CM | POA: Diagnosis not present

## 2019-11-20 NOTE — Progress Notes (Signed)
SUBJECTIVE:  Patient ID: Jimmy Blair, male    DOB: Mar 02, 1941, 79 y.o.   MRN: [id number] Chief Complaint  Patient presents with  . Follow-up    HPI  Jimmy Blair is a 79 y.o. male  The patient returns to the office for followup evaluation regarding leg swelling.  The swelling has persisted and the pain associated with swelling continues. There have not been any interval development of a ulcerations or wounds.  Since the previous visit the patient has been wearing graduated compression stockings and has noted little if any improvement in the lymphedema. The patient has been using compression routinely morning until night.  The patient also states elevation during the day and exercise is being done too.   Past Medical History:  Diagnosis Date  . Anemia of chronic disease 11/21/2015  . Chronic rhinitis 10/03/2017  . DVT (deep venous thrombosis) ([place])   . Essential hypertension 10/03/2017  . History of amaurosis fugax 10/03/2017  . Hypertension   . Incarcerated right inguinal hernia   . Pure hypercholesterolemia 10/03/2017  . Small bowel obstruction ([place])   . 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 Vaccine counseling 07/29/2016    Past Surgical History:  Procedure Laterality Date  . INGUINAL HERNIA REPAIR Right 10/17/2017   Procedure: HERNIA REPAIR INGUINAL ADULT;  Surgeon: Olean Ree, MD;  Location: [facility] ORS;  Service: General;  Laterality: Right;  . INSERTION OF MESH Right 10/17/2017   Procedure: INSERTION OF MESH;  Surgeon: Olean Ree, MD;  Location: [facility] ORS;  Service: General;  Laterality: Right;  . NO PAST SURGERIES      Social History   Socioeconomic History  . Marital status: Divorced    Spouse name: Not on file  . Number of children: Not on file  . Years of education: Not on file  . Highest education level: Not on file  Occupational History  . Not on file  Tobacco Use  . Smoking status: Never Smoker  . Smokeless tobacco: Never Used    Substance and Sexual Activity  . Alcohol use: Yes    Comment: 2-3beer daily  . Drug use: No  . Sexual activity: Never  Other Topics Concern  . Not on file  Social History Narrative  . Not on file   Social Determinants of Health   Financial Resource Strain:   . Difficulty of Paying Living Expenses: Not on file  Food Insecurity:   . Worried About Charity fundraiser in the Last Year: Not on file  . Ran Out of Food in the Last Year: Not on file  Transportation Needs:   . Lack of Transportation (Medical): Not on file  . Lack of Transportation (Non-Medical): Not on file  Physical Activity:   . Days of Exercise per Week: Not on file  . Minutes of Exercise per Session: Not on file  Stress:   . Feeling of Stress : Not on file  Social Connections:   . Frequency of Communication with Friends and Family: Not on file  . Frequency of Social Gatherings with Friends and Family: Not on file  . Attends Religious Services: Not on file  . Active Member of Clubs or Organizations: Not on file  . Attends Archivist Meetings: Not on file  . Marital Status: Not on file  Intimate Partner Violence:   . Fear of Current or Ex-Partner: Not on file  . Emotionally Abused: Not on file  . Physically Abused: Not  on file  . Sexually Abused: Not on file    Family History  Problem Relation Age of Onset  . Alzheimer's disease Mother   . Colon cancer Brother     No Known Allergies   Review of Systems   Review of Systems: Negative Unless Checked Constitutional: [] Weight loss  [] Fever  [] Chills Cardiac: [] Chest pain   []  Atrial Fibrillation  [] Palpitations   [] Shortness of breath when laying flat   [] Shortness of breath with exertion. [] Shortness of breath at rest Vascular:  [] Pain in legs with walking   [] Pain in legs with standing [] Pain in legs when laying flat   [] Claudication    [] Pain in feet when laying flat    [x] History of DVT   [] Phlebitis   [x] Swelling in legs   [] Varicose veins    [] Non-healing ulcers Pulmonary:   [] Uses home oxygen   [] Productive cough   [] Hemoptysis   [] Wheeze  [] COPD   [] Asthma Neurologic:  [] Dizziness   [] Seizures  [] Blackouts [x] History of stroke   [] History of TIA  [] Aphasia   [] Temporary Blindness   [] Weakness or numbness in arm   [] Weakness or numbness in leg Musculoskeletal:   [] Joint swelling   [] Joint pain   [] Low back pain  []  History of Knee Replacement [] Arthritis [] back Surgeries  []  Spinal Stenosis    Hematologic:  [] Easy bruising  [] Easy bleeding   [] Hypercoagulable state   [x] Anemic Gastrointestinal:  [] Diarrhea   [] Vomiting  [] Gastroesophageal reflux/heartburn   [] Difficulty swallowing. [] Abdominal pain Genitourinary:  [] Chronic kidney disease   [] Difficult urination  [] Anuric   [] Blood in urine [] Frequent urination  [] Burning with urination   [] Hematuria Skin:  [] Rashes   [] Ulcers [] Wounds Psychological:  [] History of anxiety   []  History of major depression  [x]  Memory Difficulties      OBJECTIVE:   Physical Exam  BP 129/66 (BP Location: Left Arm)   Pulse 74   Resp 15   Gen: WD/WN, NAD Head: [place]/AT, No temporalis wasting.  Ear/Nose/Throat: Hearing grossly intact, nares w/o erythema or drainage Eyes: PER, EOMI, sclera nonicteric.  Neck: Supple, no masses.  No JVD.  Pulmonary:  Good air movement, no use of accessory muscles.  Cardiac: RRR Vascular:  Unable to palpate pulses due to edema, 3+ edema bilaterally Vessel Right Left  Radial Palpable Palpable   Gastrointestinal: soft, non-distended. No guarding/no peritoneal signs.  Musculoskeletal: M/S 5/5 throughout.  No deformity or atrophy.  Neurologic: Pain and light touch intact in extremities.  Symmetrical.  Speech is fluent. Motor exam as listed above. Psychiatric: Judgment intact, Mood & affect appropriate for pt's clinical situation. Dermatologic:  Stasis dermatitis bilaterally. No Ulcers Noted.  No changes consistent with cellulitis. Lymph : No Cervical lymphadenopathy,  lichenification bilaterally with dermal thickening bilaterally      ASSESSMENT AND PLAN:  1. Lymphedema Recommend:  No surgery or intervention at this point in time.    I have reviewed my previous discussion with the patient regarding swelling and why it causes symptoms.  Patient will continue wearing graduated compression stockings class 1 (20-30 mmHg) on a daily basis. The patient will  beginning wearing the stockings first thing in the morning and removing them in the evening. The patient is instructed specifically not to sleep in the stockings.    In addition, behavioral modification including several periods of elevation of the lower extremities during the day will be continued.  This was reviewed with the patient during the initial visit.  The patient will also continue  routine exercise, especially walking on a daily basis as was discussed during the initial visit.    Despite more than 4 weeks of conservative treatments including graduated compression therapy class 1 and behavioral modification including exercise and elevation the patient  has not obtained adequate control of the lymphedema.  The patient still has stage 3 lymphedema and therefore, I believe that a lymph pump should be added to improve the control of the patient's lymphedema.  Additionally, a lymph pump is warranted because it will reduce the risk of cellulitis and ulceration in the future.  Patient should follow-up in eight weeks.    2. Cognitive decline This makes him prone to conservative therapy a little bit more difficult however the patient's family and nursing help have been diligent with ensuring that he maintains with conservative therapy.  3. Pure hypercholesterolemia Continue statin as ordered and reviewed, no changes at this time    Current Outpatient Medications on File Prior to Visit  Medication Sig Dispense Refill  . acetaminophen (TYLENOL) 500 MG tablet Take by mouth.    Marland Kitchen atorvastatin (LIPITOR)  20 MG tablet Take 1 tablet by mouth every morning.     . cetirizine (ZYRTEC) 10 MG tablet Take by mouth as needed.     . furosemide (LASIX) 20 MG tablet TAKE 1 TABLET(20 MG) BY MOUTH EVERY DAY AS NEEDED FOR SWELLING    . lisinopril (PRINIVIL,ZESTRIL) 40 MG tablet Take 1 tablet by mouth every morning.     . Rivaroxaban 15 & 20 MG TBPK Take as directed on package: Start with one 15mg  tablet by mouth twice a day with food. On Day 22, switch to one 20mg  tablet once a day with food. 51 each 0   No current facility-administered medications on file prior to visit.    There are no Patient Instructions on file for this visit. No follow-ups on file.   Kris Hartmann, NP  This note was completed with Sales executive.  Any errors are purely unintentional.

## 2019-11-22 ENCOUNTER — Telehealth (INDEPENDENT_AMBULATORY_CARE_PROVIDER_SITE_OTHER): Payer: Self-pay

## 2019-11-22 NOTE — Telephone Encounter (Signed)
I reached out to [facility] regarding this patient and getting his pump. Per Catalina Antigua they have attempted to contact the patient on multiple occassions and his voicemail is full and there have not been any return calls from the patient.  Per Catalina Antigua he will attempt to contact the patient again today and into next week as well.

## 2019-12-06 ENCOUNTER — Other Ambulatory Visit: Payer: Self-pay

## 2019-12-06 ENCOUNTER — Encounter: Payer: Medicare Other | Attending: Physician Assistant | Admitting: Physician Assistant

## 2019-12-06 DIAGNOSIS — Z86718 Personal history of other venous thrombosis and embolism: Secondary | ICD-10-CM | POA: Diagnosis not present

## 2019-12-06 DIAGNOSIS — I10 Essential (primary) hypertension: Secondary | ICD-10-CM | POA: Diagnosis not present

## 2019-12-06 DIAGNOSIS — G3184 Mild cognitive impairment, so stated: Secondary | ICD-10-CM | POA: Diagnosis not present

## 2019-12-06 DIAGNOSIS — E7800 Pure hypercholesterolemia, unspecified: Secondary | ICD-10-CM | POA: Insufficient documentation

## 2019-12-06 DIAGNOSIS — I890 Lymphedema, not elsewhere classified: Secondary | ICD-10-CM | POA: Insufficient documentation

## 2019-12-06 DIAGNOSIS — I872 Venous insufficiency (chronic) (peripheral): Secondary | ICD-10-CM | POA: Diagnosis not present

## 2019-12-06 DIAGNOSIS — Z859 Personal history of malignant neoplasm, unspecified: Secondary | ICD-10-CM | POA: Insufficient documentation

## 2019-12-06 DIAGNOSIS — L89893 Pressure ulcer of other site, stage 3: Secondary | ICD-10-CM | POA: Insufficient documentation

## 2019-12-06 NOTE — Progress Notes (Addendum)
ANGELICA, STABLEIN ([id number]) Visit Report for 12/06/2019 Allergy List Details Patient Name: Jimmy Blair, Jimmy Blair Date of Service: 12/06/2019 9:45 AM Medical Record Number: [id number] Patient Account Number: [id number] Date of Birth/Sex: 1941-07-21 (79 y.o. M) Treating RN: Montey Hora Primary Care Athalia Setterlund: Dion Body Other Clinician: Referring Alisandra Son: Dion Body Treating Kabria Hetzer/Extender: Melburn Hake, HOYT Weeks in Treatment: 0 Allergies Active Allergies No Known Drug Allergies Allergy Notes Electronic Signature(s) Signed: 12/06/2019 4:59:12 PM By: Montey Hora Entered By: Montey Hora on 12/06/2019 09:45:54 Sarria, Marcello Moores ([id number]) -------------------------------------------------------------------------------- Arrival Information Details Patient Name: Jimmy Blair Date of Service: 12/06/2019 9:45 AM Medical Record Number: [id number] Patient Account Number: [id number] Date of Birth/Sex: September 13, 1941 (79 y.o. M) Treating RN: Army Melia Primary Care Kamron Portee: Dion Body Other Clinician: Referring Janett Kamath: Dion Body Treating Jesiah Yerby/Extender: Melburn Hake, HOYT Weeks in Treatment: 0 Visit Information Patient Arrived: Ambulatory Arrival Time: 09:42 Accompanied By: daughter Transfer Assistance: None Patient Identification Verified: Yes Secondary Verification Process Completed: Yes Electronic Signature(s) Signed: 12/06/2019 11:49:35 AM By: Lorine Bears RCP, RRT, CHT Entered By: Lorine Bears on 12/06/2019 09:42:25 Jimmy Blair ([id number]) -------------------------------------------------------------------------------- Clinic Level of Care Assessment Details Patient Name: Jimmy Blair Date of Service: 12/06/2019 9:45 AM Medical Record Number: [id number] Patient Account Number: [id number] Date of Birth/Sex: Mar 02, 1941 (79 y.o. M) Treating RN: Army Melia Primary Care Dorothia Passmore: Dion Body Other  Clinician: Referring Truitt Cruey: Dion Body Treating Deette Revak/Extender: Melburn Hake, HOYT Weeks in Treatment: 0 Clinic Level of Care Assessment Items TOOL 1 Quantity Score []  - Use when EandM and Procedure is performed on INITIAL visit 0 ASSESSMENTS - Nursing Assessment / Reassessment X - General Physical Exam (combine w/ comprehensive assessment (listed just below) when 1 20 performed on new pt. evals) X- 1 25 Comprehensive Assessment (HX, ROS, Risk Assessments, Wounds Hx, etc.) ASSESSMENTS - Wound and Skin Assessment / Reassessment []  - Dermatologic / Skin Assessment (not related to wound area) 0 ASSESSMENTS - Ostomy and/or Continence Assessment and Care []  - Incontinence Assessment and Management 0 []  - 0 Ostomy Care Assessment and Management (repouching, etc.) PROCESS - Coordination of Care X - Simple Patient / Family Education for ongoing care 1 15 []  - 0 Complex (extensive) Patient / Family Education for ongoing care X- 1 10 Staff obtains Programmer, systems, Records, Test Results / Process Orders []  - 0 Staff telephones HHA, Nursing Homes / Clarify orders / etc []  - 0 Routine Transfer to another Facility (non-emergent condition) []  - 0 Routine Hospital Admission (non-emergent condition) X- 1 15 New Admissions / Biomedical engineer / Ordering NPWT, Apligraf, etc. []  - 0 Emergency Hospital Admission (emergent condition) PROCESS - Special Needs []  - Pediatric / Minor Patient Management 0 []  - 0 Isolation Patient Management []  - 0 Hearing / Language / Visual special needs []  - 0 Assessment of Community assistance (transportation, D/C planning, etc.) []  - 0 Additional assistance / Altered mentation []  - 0 Support Surface(s) Assessment (bed, cushion, seat, etc.) Canter, Kristen ([id number]) INTERVENTIONS - Miscellaneous []  - External ear exam 0 []  - 0 Patient Transfer (multiple staff / Civil Service fast streamer / Similar devices) []  - 0 Simple Staple / Suture removal (25 or  less) []  - 0 Complex Staple / Suture removal (26 or more) []  - 0 Hypo/Hyperglycemic Management (do not check if billed separately) []  - 0 Ankle / Brachial Index (ABI) - do not check if billed separately Has the patient been seen at the hospital within the last three years: Yes Total Score: 85 Level Of Care: New/Established - Level 3 Electronic Signature(s)  Signed: 12/06/2019 11:39:54 AM By: Army Melia Entered By: Army Melia on 12/06/2019 10:24:20 Jimmy Blair ([id number]) -------------------------------------------------------------------------------- Encounter Discharge Information Details Patient Name: Jimmy Blair Date of Service: 12/06/2019 9:45 AM Medical Record Number: [id number] Patient Account Number: [id number] Date of Birth/Sex: Oct 09, 1941 (79 y.o. M) Treating RN: Army Melia Primary Care Ann-Marie Kluge: Dion Body Other Clinician: Referring Joeziah Voit: Dion Body Treating Chanita Boden/Extender: Melburn Hake, HOYT Weeks in Treatment: 0 Encounter Discharge Information Items Post Procedure Vitals Discharge Condition: Stable Temperature (F): 99.0 Ambulatory Status: Ambulatory Pulse (bpm): 96 Discharge Destination: Home Respiratory Rate (breaths/min): 16 Transportation: Private Auto Blood Pressure (mmHg): 132/82 Accompanied By: daughter Schedule Follow-up Appointment: Yes Clinical Summary of Care: Electronic Signature(s) Signed: 12/06/2019 11:39:54 AM By: Army Melia Entered By: Army Melia on 12/06/2019 10:27:42 Jimmy Blair ([id number]) -------------------------------------------------------------------------------- Lower Extremity Assessment Details Patient Name: Jimmy Blair Date of Service: 12/06/2019 9:45 AM Medical Record Number: [id number] Patient Account Number: [id number] Date of Birth/Sex: 22-May-1941 (79 y.o. M) Treating RN: Montey Hora Primary Care Shaylyn Bawa: Dion Body Other Clinician: Referring Pammy Vesey: Dion Body Treating Hanne Kegg/Extender: Melburn Hake, HOYT Weeks in Treatment: 0 Edema Assessment Assessed: [Left: No] [Right: No] Edema: [Left: Yes] [Right: Yes] Calf Left: Right: Point of Measurement: 34 cm From Medial Instep 42 cm 37 cm Ankle Left: Right: Point of Measurement: 12 cm From Medial Instep 30 cm 26 cm Vascular Assessment Pulses: Dorsalis Pedis Palpable: [Left:Yes] [Right:Yes] Doppler Audible: [Left:Yes] [Right:Yes] Posterior Tibial Palpable: [Left:Yes] [Right:Yes] Doppler Audible: [Left:Yes] [Right:Yes] Blood Pressure: Brachial: [Left:134] [Right:130] Dorsalis Pedis: 142 [Left:Dorsalis Pedis: 158] Ankle: Posterior Tibial: 134 [Left:Posterior Tibial: 152 1.06] [Right:1.18] Electronic Signature(s) Signed: 12/06/2019 4:59:12 PM By: Montey Hora Entered By: Montey Hora on 12/06/2019 10:06:19 Jimmy Blair ([id number]) -------------------------------------------------------------------------------- Multi Wound Chart Details Patient Name: Jimmy Blair Date of Service: 12/06/2019 9:45 AM Medical Record Number: [id number] Patient Account Number: [id number] Date of Birth/Sex: [birth year] (78 y.o. M) Treating RN: Army Melia Primary Care Celestia Duva: Dion Body Other Clinician: Referring Suad Autrey: Dion Body Treating Jian Hodgman/Extender: Melburn Hake, HOYT Weeks in Treatment: 0 Vital Signs Height(in): 72 Pulse(bpm): 96 Weight(lbs): 149 Blood Pressure(mmHg): 132/82 Body Mass Index(BMI): 20 Temperature(F): 99.0 Respiratory Rate 18 (breaths/min): Photos: [N/A:N/A] Wound Location: Left Foot - Dorsal N/A N/A Wounding Event: Pressure Injury N/A N/A Primary Etiology: Pressure Ulcer N/A N/A Date Acquired: 11/22/2019 N/A N/A Weeks of Treatment: 0 N/A N/A Wound Status: Open N/A N/A Measurements L x W x D 1.4x3.4x0.1 N/A N/A (cm) Area (cm) : 3.738 N/A N/A Volume (cm) : 0.374 N/A N/A Classification: Category/Stage III N/A N/A Exudate Amount: Medium N/A  N/A Exudate Type: Serous N/A N/A Exudate Color: amber N/A N/A Wound Margin: Flat and Intact N/A N/A Granulation Amount: Small (1-33%) N/A N/A Granulation Quality: Pink N/A N/A Necrotic Amount: Large (67-100%) N/A N/A Necrotic Tissue: Eschar, Adherent Slough N/A N/A Exposed Structures: Fat Layer (Subcutaneous N/A N/A Tissue) Exposed: Yes Fascia: No Tendon: No Muscle: No Joint: No Bone: No Epithelialization: None N/A N/A Treatment Notes JAKERYAN, BURNET ([id number]) Electronic Signature(s) Signed: 12/06/2019 11:39:54 AM By: Army Melia Entered By: Army Melia on 12/06/2019 10:17:38 Jimmy Blair ([id number]) -------------------------------------------------------------------------------- Multi-Disciplinary Care Plan Details Patient Name: Jimmy Blair Date of Service: 12/06/2019 9:45 AM Medical Record Number: [id number] Patient Account Number: [id number] Date of Birth/Sex: 1940-12-03 (78 y.o. M) Treating RN: Army Melia Primary Care Autry Droege: Dion Body Other Clinician: Referring Issis Lindseth: Dion Body Treating Micha Dosanjh/Extender: Melburn Hake, HOYT Weeks in Treatment: 0 Active Inactive Orientation to the Wound Care Program Nursing Diagnoses: Knowledge deficit related to the wound healing center program Goals:  Patient/caregiver will verbalize understanding of the [place] Program Date Initiated: 12/06/2019 Target Resolution Date: 01/03/2020 Goal Status: Active Interventions: Provide education on orientation to the wound center Notes: Pressure Nursing Diagnoses: Knowledge deficit related to causes and risk factors for pressure ulcer development Goals: Patient/caregiver will verbalize risk factors for pressure ulcer development Date Initiated: 12/06/2019 Target Resolution Date: 01/03/2020 Goal Status: Active Interventions: Assess: immobility, friction, shearing, incontinence upon admission and as needed Notes: Wound/Skin Impairment Nursing  Diagnoses: Impaired tissue integrity Goals: Ulcer/skin breakdown will have a volume reduction of 30% by week 4 Date Initiated: 12/06/2019 Target Resolution Date: 01/03/2020 Goal Status: Active Interventions: Assess ulceration(s) every visit MESHAL, BREUNINGER ([id number]) Notes: Electronic Signature(s) Signed: 12/06/2019 11:39:54 AM By: Army Melia Entered By: Army Melia on 12/06/2019 10:17:21 Jimmy Blair ([id number]) -------------------------------------------------------------------------------- Pain Assessment Details Patient Name: Jimmy Blair Date of Service: 12/06/2019 9:45 AM Medical Record Number: [id number] Patient Account Number: [id number] Date of Birth/Sex: [birth year] (78 y.o. M) Treating RN: Army Melia Primary Care Yenni Carra: Dion Body Other Clinician: Referring Yvan Dority: Dion Body Treating Sylvanus Telford/Extender: Melburn Hake, HOYT Weeks in Treatment: 0 Active Problems Location of Pain Severity and Description of Pain Patient Has Paino Yes Site Locations Rate the pain. Current Pain Level: 5 Pain Management and Medication Current Pain Management: Electronic Signature(s) Signed: 12/06/2019 11:39:54 AM By: Army Melia Signed: 12/06/2019 11:49:35 AM By: Lorine Bears RCP, RRT, CHT Entered By: Lorine Bears on 12/06/2019 09:43:04 Jimmy Blair ([id number]) -------------------------------------------------------------------------------- Patient/Caregiver Education Details Patient Name: Jimmy Blair Date of Service: 12/06/2019 9:45 AM Medical Record Number: [id number] Patient Account Number: [id number] Date of Birth/Gender: April 11, 1941 (78 y.o. M) Treating RN: Army Melia Primary Care Physician: Dion Body Other Clinician: Referring Physician: Dion Body Treating Physician/Extender: Sharalyn Ink in Treatment: 0 Education Assessment Education Provided To: Patient Education Topics  Provided Wound/Skin Impairment: Handouts: Caring for Your Ulcer Methods: Demonstration, Explain/Verbal Responses: State content correctly Electronic Signature(s) Signed: 12/06/2019 11:39:54 AM By: Army Melia Entered By: Army Melia on 12/06/2019 10:24:32 Jimmy Blair ([id number]) -------------------------------------------------------------------------------- Wound Assessment Details Patient Name: Jimmy Blair Date of Service: 12/06/2019 9:45 AM Medical Record Number: [id number] Patient Account Number: [id number] Date of Birth/Sex: 01/13/1941 (78 y.o. M) Treating RN: Army Melia Primary Care Binyamin Nelis: Dion Body Other Clinician: Referring Venera Privott: Dion Body Treating Maralyn Witherell/Extender: Melburn Hake, HOYT Weeks in Treatment: 0 Wound Status Wound Number: 1 Primary Etiology: Pressure Ulcer Wound Location: Left, Dorsal Foot Wound Status: Open Wounding Event: Pressure Injury Date Acquired: 11/22/2019 Weeks Of Treatment: 0 Clustered Wound: No Photos Wound Measurements Length: (cm) 1.4 % Reduction Width: (cm) 3.4 % Reduction Depth: (cm) 0.1 Epithelializ Area: (cm) 3.738 Tunneling: Volume: (cm) 0.374 Undermining in Area: 0% in Volume: 0% ation: None No : No Wound Description Classification: Category/Stage III Foul Odor Af Wound Margin: Flat and Intact Slough/Fibri Exudate Amount: Medium Exudate Type: Serous Exudate Color: amber ter Cleansing: No no Yes Wound Bed Granulation Amount: Small (1-33%) Exposed Structure Granulation Quality: Pink Fascia Exposed: No Necrotic Amount: Large (67-100%) Fat Layer (Subcutaneous Tissue) Exposed: Yes Necrotic Quality: Eschar, Adherent Slough Tendon Exposed: No Muscle Exposed: No Joint Exposed: No Bone Exposed: No Treatment Notes RAEQUON, ZETTLER ([id number]) Wound #1 (Left, Dorsal Foot) Notes iodoflex, BFD Electronic Signature(s) Signed: 12/10/2019 10:48:43 AM By: Army Melia Previous Signature: 12/06/2019  4:59:12 PM Version By: Montey Hora Entered By: Army Melia on 12/07/2019 13:16:08 Jimmy Blair ([id number]) -------------------------------------------------------------------------------- Vitals Details Patient Name: Jimmy Blair Date of Service: 12/06/2019 9:45 AM Medical Record Number: [id number] Patient Account Number: [id number] Date of  Birth/Sex: 05-24-1941 (79 y.o. M) Treating RN: Army Melia Primary Care Meyer Arora: Dion Body Other Clinician: Referring Reesha Debes: Dion Body Treating Keon Pender/Extender: Melburn Hake, HOYT Weeks in Treatment: 0 Vital Signs Time Taken: 09:40 Temperature (F): 99.0 Height (in): 72 Pulse (bpm): 96 Source: Stated Respiratory Rate (breaths/min): 18 Weight (lbs): 149 Blood Pressure (mmHg): 132/82 Source: Measured Reference Range: 80 - 120 mg / dl Body Mass Index (BMI): 20.2 Electronic Signature(s) Signed: 12/06/2019 11:49:35 AM By: Lorine Bears RCP, RRT, CHT Entered By: Lorine Bears on 12/06/2019 09:44:16

## 2019-12-06 NOTE — Progress Notes (Addendum)
Jimmy Blair ([id number]) Visit Report for 12/06/2019 Chief Complaint Document Details Patient Name: Jimmy Blair Date of Service: 12/06/2019 9:45 AM Medical Record Number: [id number] Patient Account Number: [id number] Date of Birth/Sex: May 16, 1941 (79 y.o. M) Treating RN: Army Melia Primary Care Provider: Dion Body Other Clinician: Referring Provider: Dion Body Treating Provider/Extender: Melburn Hake, Braden Deloach Weeks in Treatment: 0 Information Obtained from: Patient Chief Complaint Left foot dorsal pressure ulcer Electronic Signature(s) Signed: 12/06/2019 10:14:24 AM By: Worthy Keeler PA-C Entered By: Worthy Keeler on 12/06/2019 10:14:23 Jimmy Blair, Jimmy Blair ([id number]) -------------------------------------------------------------------------------- Debridement Details Patient Name: Jimmy Blair Date of Service: 12/06/2019 9:45 AM Medical Record Number: [id number] Patient Account Number: [id number] Date of Birth/Sex: August 11, 1941 (79 y.o. M) Treating RN: Army Melia Primary Care Provider: Dion Body Other Clinician: Referring Provider: Dion Body Treating Provider/Extender: Melburn Hake, Clive Parcel Weeks in Treatment: 0 Debridement Performed for Wound #1 Left,Dorsal Foot Assessment: Performed By: Physician STONE III, Roel Douthat E., PA-C Debridement Type: Debridement Level of Consciousness (Pre- Awake and Alert procedure): Pre-procedure Verification/Time Yes - 10:15 Out Taken: Start Time: 10:16 Pain Control: Lidocaine Total Area Debrided (L x W): 1.4 (cm) x 3.4 (cm) = 4.76 (cm) Tissue and other material Non-Viable, Slough, Subcutaneous, Slough debrided: Level: Skin/Subcutaneous Tissue Debridement Description: Excisional Instrument: Curette Bleeding: Minimum Hemostasis Achieved: Pressure End Time: 10:18 Response to Treatment: Procedure was tolerated well Level of Consciousness Awake and Alert (Post-procedure): Post Debridement Measurements of  Total Wound Length: (cm) 1.4 Stage: Category/Stage III Width: (cm) 3.4 Depth: (cm) 0.1 Volume: (cm) 0.374 Character of Wound/Ulcer Post Stable Debridement: Post Procedure Diagnosis Same as Pre-procedure Electronic Signature(s) Signed: 12/06/2019 11:39:54 AM By: Army Melia Signed: 12/06/2019 5:05:52 PM By: Worthy Keeler PA-C Entered By: Army Melia on 12/06/2019 10:18:30 Jimmy Blair ([id number]) -------------------------------------------------------------------------------- HPI Details Patient Name: Jimmy Blair Date of Service: 12/06/2019 9:45 AM Medical Record Number: [id number] Patient Account Number: [id number] Date of Birth/Sex: Nov 12, 1940 (79 y.o. M) Treating RN: Army Melia Primary Care Provider: Dion Body Other Clinician: Referring Provider: Dion Body Treating Provider/Extender: Melburn Hake, Eliz Nigg Weeks in Treatment: 0 History of Present Illness HPI Description: 12/06/2019 upon evaluation today patient presents for initial evaluation here in our clinic concerning issues that he is having with his dorsal foot on the left. This secondary to a excessive swelling that occurred pushing on his shoe causing a pressure breakdown. He does have known venous stasis which I think is one of the main issues that has led to the initial swelling and then pressure injury. Nonetheless he seems to be showing some signs of improvement although he still has significant lymphedema and venous stasis as well. He has previously been recommended by vascular to have lymphedema pumps as well as compression stockings they were also rapid him. He will not however wear the compression stockings according to his daughter he also would not use the lymphedema pumps. He told them to send them back. Likewise anytime they wrapped him she states that she will be driving back home and he will be on rapid on the way back home. Obviously I think this is good to be somewhat a compliance issue  when it comes to the edema control especially if we end up needing the wrap the patient. It is possible he may heal without the need for wrapping but at the same time does not [id number] certain. If he is not showing signs of good healing and improvement then we will need to consider initiating compression wrapping. The patient otherwise does have a history of again venous  insufficiency, lymphedema, and mild cognitive impairment which I think is part of the reason that he is having some issues with compliance most likely. The patient does seem to be pleasant but states that he does not like the compression socks at all. Electronic Signature(s) Signed: 12/06/2019 10:34:15 AM By: Worthy Keeler PA-C Entered By: Worthy Keeler on 12/06/2019 10:34:14 Jimmy Blair, Jimmy Blair ([id number]) -------------------------------------------------------------------------------- Physical Exam Details Patient Name: Jimmy Blair Date of Service: 12/06/2019 9:45 AM Medical Record Number: [id number] Patient Account Number: [id number] Date of Birth/Sex: Apr 23, 1941 (79 y.o. M) Treating RN: Army Melia Primary Care Provider: Dion Body Other Clinician: Referring Provider: Dion Body Treating Provider/Extender: Melburn Hake, Khing Belcher Weeks in Treatment: 0 Constitutional sitting or standing blood pressure is within target range for patient.. pulse regular and within target range for patient.Marland Kitchen respirations regular, non-labored and within target range for patient.Marland Kitchen temperature within target range for patient.. Well- nourished and well-hydrated in no acute distress. Eyes conjunctiva clear no eyelid edema noted. pupils equal round and reactive to light and accommodation. Ears, Nose, Mouth, and Throat no gross abnormality of ear auricles or external auditory canals. normal hearing noted during conversation. mucus membranes moist. Respiratory normal breathing without difficulty. Cardiovascular 2+ dorsalis  pedis/posterior tibialis pulses. 2+ pitting edema of the bilateral lower extremities. Gastrointestinal (GI) soft, non-tender, non-distended, +BS. no ventral hernia noted. Musculoskeletal unsteady while walking. Psychiatric this patient is able to make decisions and demonstrates good insight into disease process. Alert and Oriented x 3. pleasant and cooperative. Notes Upon inspection patient's wound bed actually showed signs of necrotic tissue on the surface of the wound which is going require sharp debridement today. Fortunately there is no evidence of active infection at this time which is good news. He does have edema of the bilateral lower extremities really the left and right are about equal in this respect. He has good pulses and may have mild cognitive impairment Electronic Signature(s) Signed: 12/06/2019 10:34:54 AM By: Worthy Keeler PA-C Entered By: Worthy Keeler on 12/06/2019 10:34:54 Jimmy Blair ([id number]) -------------------------------------------------------------------------------- Physician Orders Details Patient Name: Jimmy Blair Date of Service: 12/06/2019 9:45 AM Medical Record Number: [id number] Patient Account Number: [id number] Date of Birth/Sex: 06/02/1941 (78 y.o. 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 - in office o Dial antibacterial soap, wash wounds, rinse and pat dry prior to dressing wounds Primary Wound Dressing Wound #1 Left,Dorsal Foot o Iodoflex Secondary Dressing Wound #1 Left,Dorsal Foot o Boardered Foam Dressing Dressing Change  Frequency Wound #1 Left,Dorsal Foot o Change dressing every other day. Follow-up Appointments Wound #1 Left,Dorsal Foot o Return Appointment in 1 week. Patient Medications Allergies: No Known Drug Allergies Notifications Medication Indication Start End Santyl 12/07/2019 DOSE topical 250 unit/gram ointment - ointment topical Apply nickel thick to the wound bed and then cover with a dressing as directed in clinic. Electronic Signature(s) Signed: 12/07/2019 1:28:18 PM By: Worthy Keeler PA-C Previous Signature: 12/06/2019 11:39:54 AM Version By: Army Melia Previous Signature: 12/06/2019 5:05:52 PM Version By: Worthy Keeler PA-C Entered By: Worthy Keeler on 12/07/2019 13:28:18 Radwan, Stageco[place] ([id number]) TRAYSHAWN, SHADDY ([id number]) -------------------------------------------------------------------------------- Problem List Details Patient Name: Jimmy Blair Date of Service: 12/06/2019 9:45 AM Medical  Record Number: [id number] Patient Account Number: [id number] Date of Birth/Sex: Mar 25, 1941 (79 y.o. 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, stage 3 12/06/2019 No Yes G31.84 Mild cognitive impairment, so stated 12/06/2019 No Yes Inactive Problems Resolved Problems Electronic Signature(s) Signed: 12/06/2019 10:32:27 AM By: Worthy Keeler PA-C Previous Signature: 12/06/2019 10:13:37 AM Version By: Worthy Keeler PA-C Entered By: Worthy Keeler on 12/06/2019 10:32:26 Jimmy Blair ([id number]) -------------------------------------------------------------------------------- Progress  Note Details Patient Name: Jimmy Blair Date of Service: 12/06/2019 9:45 AM Medical Record Number: [id number] Patient Account Number: [id number] Date of Birth/Sex: [birth year] (78 y.o. M) Treating RN: Army Melia Primary Care Provider: Dion Body Other Clinician: Referring Provider: Dion Body Treating Provider/Extender: Melburn Hake, Jax Abdelrahman Weeks in Treatment: 0 Subjective Chief Complaint Information obtained from Patient Left foot dorsal pressure ulcer History of Present Illness (HPI) 12/06/2019 upon evaluation today patient presents for initial evaluation here in our clinic concerning issues that he is having with his dorsal foot on the left. This secondary to a excessive swelling that occurred pushing on his shoe causing a pressure breakdown. He does have known venous stasis which I think is one of the main issues that has led to the initial swelling and then pressure injury. Nonetheless he seems to be showing some signs of improvement although he still has significant lymphedema and venous stasis as well. He has previously been recommended by vascular to have lymphedema pumps as well as compression stockings they were also rapid him. He will not however wear the compression stockings according to his daughter he also would not use the lymphedema pumps. He told them to send them back. Likewise anytime they wrapped him she states that she will be driving back home and he will be on rapid on the way back home. Obviously I think this is good to be somewhat a compliance issue when it comes to the edema control especially if we end up needing the wrap the patient. It is possible he may heal without the need for wrapping but at the same time does not [id number] certain. If he is not showing signs of good healing and improvement then we will need to consider initiating compression wrapping. The patient otherwise does have a history of again venous insufficiency, lymphedema, and mild  cognitive impairment which I think is part of the reason that he is having some issues with compliance most likely. The patient does seem to be pleasant but states that he does not like the compression socks at all. Patient History Information obtained from Patient. Allergies No Known Drug Allergies Family History Cancer - Siblings, No family history of Diabetes, Heart Disease, Hereditary Spherocytosis, Hypertension, Kidney Disease, Lung Disease, Seizures, Stroke, Thyroid Problems, Tuberculosis. Social History Never smoker, Marital Status - Divorced, Alcohol Use - Moderate, Drug Use - No History, Caffeine Use - Daily. 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 Eyes Denies complaints or symptoms of Dry Eyes, Vision Changes, Glasses / Contacts. Ear/Nose/Mouth/Throat Denies complaints or symptoms of Difficult clearing ears, Sinusitis. Hematologic/Lymphatic Denies complaints or symptoms of Bleeding / Clotting Disorders, Human Immunodeficiency Virus. Respiratory Denies complaints or symptoms of Chronic or frequent coughs, Shortness of Breath. Cardiovascular Complains or has symptoms of LE edema. Denies complaints or symptoms of Chest  pain. Gastrointestinal Denies complaints or symptoms of Frequent diarrhea, Nausea, Vomiting. Endocrine Denies complaints or symptoms of Hepatitis, Thyroid disease, Polydypsia (Excessive Thirst). Genitourinary Denies complaints or symptoms of Kidney failure/ Dialysis, Incontinence/dribbling. Immunological Denies complaints or symptoms of Hives, Itching. Integumentary (Skin) Complains or has symptoms of Wounds, Swelling. Denies complaints or symptoms of Bleeding or bruising tendency, Breakdown. Musculoskeletal Denies complaints or symptoms of Muscle Pain, Muscle Weakness. Neurologic Denies complaints or symptoms of Numbness/parasthesias, Focal/Weakness. Psychiatric Denies complaints or symptoms of Anxiety, Claustrophobia. Objective Constitutional sitting or standing blood pressure is within target range for patient.. pulse regular and within target range for patient.Marland Kitchen respirations regular, non-labored and within target range for patient.Marland Kitchen temperature within target range for patient.. Well- nourished and well-hydrated in no acute distress. Vitals Time Taken: 9:40 AM, Height: 72 in, Source: Stated, Weight: 149 lbs, Source: Measured, BMI: 20.2, Temperature: 99.0 F, Pulse: 96 bpm, Respiratory Rate: 18 breaths/min, Blood Pressure: 132/82 mmHg. Eyes conjunctiva clear no eyelid edema noted. pupils equal round and reactive to light and accommodation. Jimmy Blair, Jimmy Blair ([id number]) Ears, Nose, Mouth, and Throat no gross abnormality of ear auricles or external auditory canals. normal hearing noted during conversation. mucus membranes moist. Respiratory normal breathing without difficulty. Cardiovascular 2+ dorsalis pedis/posterior tibialis pulses. 2+ pitting edema of the bilateral lower extremities. Gastrointestinal (GI) soft, non-tender, non-distended, +BS. no ventral hernia noted. Musculoskeletal unsteady while walking. Psychiatric this patient is able to make decisions and demonstrates  good insight into disease process. Alert and Oriented x 3. pleasant and cooperative. General Notes: Upon inspection patient's wound bed actually showed signs of necrotic tissue on the surface of the wound which is going require sharp debridement today. Fortunately there is no evidence of active infection at this time which is good news. He does have edema of the bilateral lower extremities really the left and right are about equal in this respect. He has good pulses and may have mild cognitive impairment Integumentary (Hair, Skin) Wound #1 status is Open. Original cause of wound was Pressure Injury. The wound is located on the Left,Dorsal Foot. The wound measures 1.4cm length x 3.4cm width x 0.1cm depth; 3.738cm^2 area and 0.374cm^3 volume. There is Fat Layer (Subcutaneous Tissue) Exposed exposed. There is no tunneling or undermining noted. There is a medium amount of serous drainage noted. The wound margin is flat and intact. There is small (1-33%) pink granulation within the wound bed. There is a large (67-100%) amount of necrotic tissue within the wound bed including Eschar and Adherent Slough. Assessment Active Problems ICD-10 Venous insufficiency (chronic) (peripheral) Lymphedema, not elsewhere classified Pressure ulcer of other site, stage 3 Mild cognitive impairment, so stated Procedures Wound #1 Pre-procedure diagnosis of Wound #1 is a Pressure Ulcer located on the Left,Dorsal Foot . There was a Excisional Skin/Subcutaneous Tissue Debridement with a total area of 4.76 sq cm performed by STONE III, Kentrail Shew E., PA-C. With the following instrument(s): Curette to remove Non-Viable tissue/material. Material removed includes Subcutaneous Tissue  and Slough and after achieving pain control using Lidocaine. A time out was conducted at 10:15, prior to the start of the Jimmy Blair, Jimmy Blair ([id number]) procedure. A Minimum amount of bleeding was controlled with Pressure. The procedure was tolerated  well. Post Debridement Measurements: 1.4cm length x 3.4cm width x 0.1cm depth; 0.374cm^3 volume. Post debridement Stage noted as Category/Stage III. Character of Wound/Ulcer Post Debridement is stable. Post procedure Diagnosis Wound #1: Same as Pre-Procedure Plan Wound Cleansing: Wound #1 Left,Dorsal Foot: Clean wound with Normal Saline. - in office Dial antibacterial soap, wash wounds, rinse and pat dry prior to dressing wounds Primary Wound Dressing: Wound #1 Left,Dorsal Foot: Iodoflex Secondary Dressing: Wound #1 Left,Dorsal Foot: Boardered Foam Dressing Dressing Change Frequency: Wound #1 Left,Dorsal Foot: Change dressing every other day. Follow-up Appointments: Wound #1 Left,Dorsal Foot: Return Appointment in 1 week. The following medication(s) was prescribed: Santyl topical 250 unit/gram ointment ointment topical Apply nickel thick to the wound bed and then cover with a dressing as directed in clinic. starting 12/07/2019 1. My suggestion at this point is good to be that we initially tried to utilize a dressing with Iodoflex followed by border foam dressing over top which will pad things somewhat. 2. I am also going to suggest the patient really should be wearing compression socks he will not really do that we may consider even Tubigrip next week pending how things are doing. If it still not showing signs of improvement we may need to strongly consider compression wraps. 3. I am also going to recommend that the patient needs to elevate his legs as much as possible to try to help with edema control. 4. I did also discussed that I do believe weekly visits is going to be what we need to focus on here to try to help get things moving in the right direction and ensure nothing becomes infected. We will see patient back for reevaluation in 1 week here in the clinic. If anything worsens or changes patient will contact our office for additional recommendations. Electronic  Signature(s) Signed: 12/07/2019 1:28:53 PM By: Worthy Keeler PA-C Previous Signature: 12/06/2019 10:57:07 AM Version By: Worthy Keeler PA-C Entered By: Worthy Keeler on 12/07/2019 13:28:53 Jimmy Blair, Jimmy Blair ([id number]) -------------------------------------------------------------------------------- ROS/PFSH Details Patient Name: Jimmy Blair Date of Service: 12/06/2019 9:45 AM Medical Record Number: [id number] Patient Account Number: [id number] Date of Birth/Sex: 12/11/1940 (78 y.o. M) Treating RN: Montey Hora Primary Care Provider: Dion Body Other Clinician: Referring Provider: Dion Body Treating Provider/Extender: Melburn Hake, Jamirah Zelaya Weeks in Treatment: 0 Information Obtained From Patient Constitutional Symptoms (General Health) Complaints and Symptoms: Negative for: Fatigue; Fever; Chills; Marked Weight Change Eyes Complaints and Symptoms: Negative for: Dry Eyes; Vision Changes; Glasses / Contacts Ear/Nose/Mouth/Throat Complaints and Symptoms: Negative for: Difficult clearing ears; Sinusitis Hematologic/Lymphatic Complaints and Symptoms: Negative for: Bleeding / Clotting Disorders; Human Immunodeficiency Virus Medical History: Positive for: Anemia Negative for: Hemophilia; Human Immunodeficiency Virus; Lymphedema; Sickle Cell Disease Respiratory Complaints and Symptoms: Negative for: Chronic or frequent coughs; Shortness of Breath Cardiovascular Complaints and Symptoms: Positive for: LE edema Negative for: Chest pain Medical History: Positive for: Deep Vein Thrombosis - L Leg 11/2018; Hypertension Negative for: Angina; Arrhythmia; Congestive Heart Failure; Coronary Artery Disease; Hypotension; Myocardial Infarction; Peripheral Arterial Disease; Peripheral Venous Disease; Phlebitis; Vasculitis Past Medical History Notes: hypercholesterolemia Gastrointestinal Complaints and Symptoms: Negative for: Frequent diarrhea; Nausea; Vomiting Jimmy Blair, Jimmy Blair  ([id number]) Endocrine Complaints and Symptoms: Negative for: Hepatitis; Thyroid disease; Polydypsia (Excessive Thirst) Genitourinary Complaints and Symptoms: Negative for: Kidney failure/  Dialysis; Incontinence/dribbling Immunological Complaints and Symptoms: Negative for: Hives; Itching Integumentary (Skin) Complaints and Symptoms: Positive for: Wounds; Swelling Negative for: Bleeding or bruising tendency; Breakdown Medical History: Negative for: History of Burn; History of pressure wounds Musculoskeletal Complaints and Symptoms: Negative for: Muscle Pain; Muscle Weakness Neurologic Complaints and Symptoms: Negative for: Numbness/parasthesias; Focal/Weakness Psychiatric Complaints and Symptoms: Negative for: Anxiety; Claustrophobia Oncologic Immunizations Pneumococcal Vaccine: Received Pneumococcal Vaccination: Yes Implantable Devices None Family and Social History Cancer: Yes - Siblings; Diabetes: No; Heart Disease: No; Hereditary Spherocytosis: No; Hypertension: No; Kidney Disease: No; Lung Disease: No; Seizures: No; Stroke: No; Thyroid Problems: No; Tuberculosis: No; Never smoker; Marital Status - Divorced; Alcohol Use: Moderate; Drug Use: No History; Caffeine Use: Daily; Financial Concerns: No; Food, Clothing or Shelter Needs: No; Support System Lacking: No; Transportation Concerns: No Electronic Signature(s) Signed: 12/06/2019 4:59:12 PM By: Montey Hora Signed: 12/06/2019 5:05:52 PM By: Worthy Keeler PA-C Entered By: Montey Hora on 12/06/2019 09:56:01 Jimmy Blair, Jimmy Blair ([id number]) Jimmy Blair, Jimmy Blair ([id number]) -------------------------------------------------------------------------------- SuperBill Details Patient Name: Jimmy Blair Date of Service: 12/06/2019 Medical Record Number: [id number] Patient Account Number: [id number] Date of Birth/Sex: 02-22-1941 (79 y.o. 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

## 2019-12-06 NOTE — Progress Notes (Signed)
CLESTON, MURIN ([id number]) Visit Report for 12/06/2019 Abuse/Suicide Risk Screen Details Patient Name: Jimmy Blair, Jimmy Blair Date of Service: 12/06/2019 9:45 AM Medical Record Number: [id number] Patient Account Number: [id number] Date of Birth/Sex: 1941-04-29 (79 y.o. M) Treating RN: Montey Hora Primary Care Nyaire Denbleyker: Dion Body Other Clinician: Referring Curtez Brallier: Dion Body Treating Jadin Kagel/Extender: Melburn Hake, HOYT Weeks in Treatment: 0 Abuse/Suicide Risk Screen Items Answer ABUSE RISK SCREEN: Has anyone close to you tried to hurt or harm you recentlyo No Do you feel uncomfortable with anyone in your familyo No Has anyone forced you do things that you didnot want to doo No Electronic Signature(s) Signed: 12/06/2019 4:59:12 PM By: Montey Hora Entered By: Montey Hora on 12/06/2019 09:49:21 Poplaski, Marcello Moores ([id number]) -------------------------------------------------------------------------------- Activities of Daily Living Details Patient Name: Jimmy Blair Date of Service: 12/06/2019 9:45 AM Medical Record Number: [id number] Patient Account Number: [id number] Date of Birth/Sex: May 29, 1941 (78 y.o. M) Treating RN: Montey Hora Primary Care Nima Kemppainen: Dion Body Other Clinician: Referring Esau Fridman: Dion Body Treating Raigan Baria/Extender: Melburn Hake, HOYT Weeks in Treatment: 0 Activities of Daily Living Items Answer Activities of Daily Living (Please select one for each item) Drive Automobile Not Able Take Medications Need Assistance Use Telephone Completely Able Care for Appearance Need Assistance Use Toilet Completely Able Bath / Shower Need Assistance Dress Self Completely Able Feed Self Completely Able Walk Need Assistance Get In / Out Bed Completely [place] Need Assistance Shop for Self Need Assistance Electronic Signature(s) Signed: 12/06/2019 4:59:12 PM By: Montey Hora Entered By: Montey Hora on 12/06/2019 09:50:09 Jimmy Blair ([id number]) -------------------------------------------------------------------------------- Education Screening Details Patient Name: Jimmy Blair Date of Service: 12/06/2019 9:45 AM Medical Record Number: [id number] Patient Account Number: [id number] Date of Birth/Sex: 07/04/1941 (78 y.o. M) Treating RN: Montey Hora Primary Care Jaquay Posthumus: Dion Body Other Clinician: Referring Robbin Escher: Dion Body Treating Floetta Brickey/Extender: Melburn Hake, HOYT Weeks in Treatment: 0 Primary Learner Assessed: Caregiver Daughter Reason Patient is not Primary Learner: cognitive decline Learning Preferences/Education Level/Primary Language Learning Preference: Explanation, Demonstration Highest Education Level: College or Above Preferred Language: English Cognitive Barrier Language Barrier: No Translator Needed: No Memory Deficit: No Emotional Barrier: No Cultural/Religious Beliefs Affecting Medical Care: No Physical Barrier Impaired Vision: No Impaired Hearing: No Decreased Hand dexterity: No Knowledge/Comprehension Knowledge Level: Medium Comprehension Level: Medium Ability to understand written Medium instructions: Ability to understand verbal Medium instructions: Motivation Anxiety Level: Calm Cooperation: Cooperative Education Importance: Acknowledges Need Interest in Health Problems: Asks Questions Perception: Coherent Willingness to Engage in Self- Medium Management Activities: Readiness to Engage in Self- Medium Management Activities: Electronic Signature(s) Signed: 12/06/2019 4:59:12 PM By: Montey Hora Entered By: Montey Hora on 12/06/2019 09:50:54 Jimmy Blair ([id number]) -------------------------------------------------------------------------------- Fall Risk Assessment Details Patient Name: Jimmy Blair Date of Service: 12/06/2019 9:45 AM Medical Record Number:  [id number] Patient Account Number: [id number] Date of Birth/Sex: 01/17/1941 (78 y.o. M) Treating RN: Montey Hora Primary Care Samba Cumba: Dion Body Other Clinician: Referring Michell Kader: Dion Body Treating Aldan Camey/Extender: Melburn Hake, HOYT Weeks in Treatment: 0 Fall Risk Assessment Items Have you had 2 or more falls in the last 12 monthso 0 No Have you had any fall that resulted in injury in the last 12 monthso 0 No FALLS RISK SCREEN History of falling - immediate or within 3 months 0 No Secondary diagnosis (Do you have 2 or more medical diagnoseso) 0 No Ambulatory aid None/bed rest/wheelchair/nurse 0 No Crutches/cane/walker 15 Yes Furniture 0 No Intravenous therapy Access/Saline/Heparin Lock 0 No Gait/Transferring Normal/ bed rest/ wheelchair 0  No Weak (short steps with or without shuffle, stooped but able to lift head while 10 Yes walking, may seek support from furniture) Impaired (short steps with shuffle, may have difficulty arising from chair, head 0 No down, impaired balance) Mental Status Oriented to own ability 0 Yes Electronic Signature(s) Signed: 12/06/2019 4:59:12 PM By: Montey Hora Entered By: Montey Hora on 12/06/2019 09:51:20 Gutt, Marcello Moores ([id number]) -------------------------------------------------------------------------------- Foot Assessment Details Patient Name: Jimmy Blair Date of Service: 12/06/2019 9:45 AM Medical Record Number: [id number] Patient Account Number: [id number] Date of Birth/Sex: October 16, 1941 (78 y.o. M) Treating RN: Montey Hora Primary Care Jayr Lupercio: Dion Body Other Clinician: Referring Rylan Kaufmann: Dion Body Treating Demorris Choyce/Extender: Melburn Hake, HOYT Weeks in Treatment: 0 Foot Assessment Items Site Locations + = Sensation present, - = Sensation absent, C = Callus, U = Ulcer R = Redness, W = Warmth, M = Maceration, PU = Pre-ulcerative lesion F = Fissure, S = Swelling, D =  Dryness Assessment Right: Left: Other Deformity: No No Prior Foot Ulcer: No No Prior Amputation: No No Charcot Joint: No No Ambulatory Status: Ambulatory Without Help Gait: Unsteady Electronic Signature(s) Signed: 12/06/2019 4:59:12 PM By: Montey Hora Entered By: Montey Hora on 12/06/2019 09:52:01 Jimmy Blair ([id number]) -------------------------------------------------------------------------------- Nutrition Risk Screening Details Patient Name: Jimmy Blair Date of Service: 12/06/2019 9:45 AM Medical Record Number: [id number] Patient Account Number: [id number] Date of Birth/Sex: 09-Dec-1940 (78 y.o. M) Treating RN: Montey Hora Primary Care Rakeen Gaillard: Dion Body Other Clinician: Referring Emmily Pellegrin: Dion Body Treating Desmund Elman/Extender: Melburn Hake, HOYT Weeks in Treatment: 0 Height (in): 72 Weight (lbs): 149 Body Mass Index (BMI): 20.2 Nutrition Risk Screening Items Score Screening NUTRITION RISK SCREEN: I have an illness or condition that made me change the kind and/or amount of 0 No food I eat I eat fewer than two meals per day 0 No I eat few fruits and vegetables, or milk products 0 No I have three or more drinks of beer, liquor or wine almost every day 0 No I have tooth or mouth problems that make it hard for me to eat 0 No I don't always have enough money to buy the food I need 0 No I eat alone most of the time 0 No I take three or more different prescribed or over-the-counter drugs a day 1 Yes Without wanting to, I have lost or gained 10 pounds in the last six months 0 No I am not always physically able to shop, cook and/or feed myself 0 No Nutrition Protocols Good Risk Protocol 0 No interventions needed Moderate Risk Protocol High Risk Proctocol Risk Level: Good Risk Score: 1 Electronic Signature(s) Signed: 12/06/2019 4:59:12 PM By: Montey Hora Entered By: Montey Hora on 12/06/2019 09:51:43

## 2019-12-13 ENCOUNTER — Ambulatory Visit: Payer: Medicare Other | Admitting: Physician Assistant

## 2019-12-20 ENCOUNTER — Ambulatory Visit: Payer: Medicare Other | Admitting: Physician Assistant

## 2019-12-31 ENCOUNTER — Encounter (HOSPITAL_BASED_OUTPATIENT_CLINIC_OR_DEPARTMENT_OTHER): Payer: Medicare Other | Attending: Internal Medicine | Admitting: Internal Medicine

## 2020-01-11 ENCOUNTER — Encounter (HOSPITAL_BASED_OUTPATIENT_CLINIC_OR_DEPARTMENT_OTHER): Payer: Medicare Other | Attending: Internal Medicine | Admitting: Internal Medicine

## 2020-01-11 ENCOUNTER — Other Ambulatory Visit: Payer: Self-pay

## 2020-01-11 DIAGNOSIS — I10 Essential (primary) hypertension: Secondary | ICD-10-CM | POA: Insufficient documentation

## 2020-01-11 DIAGNOSIS — I872 Venous insufficiency (chronic) (peripheral): Secondary | ICD-10-CM | POA: Diagnosis not present

## 2020-01-11 DIAGNOSIS — I87312 Chronic venous hypertension (idiopathic) with ulcer of left lower extremity: Secondary | ICD-10-CM | POA: Insufficient documentation

## 2020-01-11 DIAGNOSIS — Z86718 Personal history of other venous thrombosis and embolism: Secondary | ICD-10-CM | POA: Diagnosis not present

## 2020-01-11 DIAGNOSIS — Z9119 Patient's noncompliance with other medical treatment and regimen: Secondary | ICD-10-CM | POA: Insufficient documentation

## 2020-01-11 DIAGNOSIS — I890 Lymphedema, not elsewhere classified: Secondary | ICD-10-CM | POA: Insufficient documentation

## 2020-01-11 DIAGNOSIS — L97522 Non-pressure chronic ulcer of other part of left foot with fat layer exposed: Secondary | ICD-10-CM | POA: Insufficient documentation

## 2020-01-11 DIAGNOSIS — Z8673 Personal history of transient ischemic attack (TIA), and cerebral infarction without residual deficits: Secondary | ICD-10-CM | POA: Insufficient documentation

## 2020-01-11 NOTE — Progress Notes (Signed)
[name], [name] ([id number]) Visit Report for 01/11/2020 Abuse/Suicide Risk Screen Details Patient Name: Date of Service: Jimmy Blair, Jimmy Blair 01/11/2020 10:30 AM Medical Record [id number] Patient Account Number: [id number] Date of Birth/Sex: Treating RN: 11-May-1941 (79 y.o. Jimmy Blair) Carlene Coria Primary Care Verlinda Slotnick: Dion Body Other Clinician: Referring Loretta Doutt: Treating Marlina Cataldi/Extender:Robson, Parks Neptune, Dirk Dress in Treatment: 0 Abuse/Suicide Risk Screen Items Answer ABUSE RISK SCREEN: Has anyone close to you tried to hurt or harm you recentlyo No Do you feel uncomfortable with anyone in your familyo No Has anyone forced you do things that you didnt want to doo No Electronic Signature(s) Signed: 01/11/2020 5:30:50 PM By: Carlene Coria RN Entered By: Carlene Coria on 01/11/2020 11:16:55 -------------------------------------------------------------------------------- Activities of Daily Living Details Patient Name: Date of Service: BENSYN, [name] 01/11/2020 10:30 AM Medical Record [id number] Patient Account Number: [id number] Date of Birth/Sex: Treating RN: [birth year] (79 y.o. Jimmy Blair) Carlene Coria Primary Care Paizleigh Wilds: Dion Body Other Clinician: Referring Lauryn Lizardi: Treating Aedyn Mckeon/Extender:Robson, Parks Neptune, Dirk Dress in Treatment: 0 Activities of Daily Living Items Answer Activities of Daily Living (Please select one for each item) Drive Automobile Not Able Take Medications Not Able Use Telephone Not Able Care for Appearance Need Assistance Use Toilet Need Assistance Bath / Shower Completely Able Dress Self Need Assistance Feed Self Completely Able Walk Need Assistance Get In / Out Bed Need Assistance Housework Not Able Prepare Meals Not Able Handle Money Not Able Shop for Self Not Able Electronic Signature(s) Signed: 01/11/2020 5:30:50 PM By: Carlene Coria RN Entered By: Carlene Coria on 01/11/2020  11:19:10 -------------------------------------------------------------------------------- Education Screening Details Patient Name: Date of Service: [name], [name] 01/11/2020 10:30 AM Medical Record [id number] Patient Account Number: [id number] Date of Birth/Sex: Treating RN: 30-Apr-1941 (79 y.o. Jimmy Blair) Carlene Coria Primary Care Jaliah Foody: Dion Body Other Clinician: Referring Kalkidan Caudell: Treating Aujanae Mccullum/Extender:Robson, Parks Neptune, Dirk Dress in Treatment: 0 Primary Learner Assessed: Patient Learning Preferences/Education Level/Primary Language Learning Preference: Explanation Highest Education Level: High School Preferred Language: English Cognitive Barrier Language Barrier: No Translator Needed: No Memory Deficit: No Emotional Barrier: No Cultural/Religious Beliefs Affecting Medical Care: No Physical Barrier Impaired Vision: No Impaired Hearing: No Decreased Hand dexterity: No Knowledge/Comprehension Knowledge Level: Medium Comprehension Level: Medium Ability to understand written Medium instructions: Ability to understand verbal Medium instructions: Motivation Anxiety Level: Calm Cooperation: Cooperative Education Importance: Acknowledges Need Interest in Health Problems: Asks Questions Perception: Coherent Willingness to Engage in Self- High Management Activities: Readiness to Engage in Self- High Management Activities: Electronic Signature(s) Signed: 01/11/2020 5:30:50 PM By: Carlene Coria RN Entered By: Carlene Coria on 01/11/2020 11:31:11 -------------------------------------------------------------------------------- Fall Risk Assessment Details Patient Name: Date of Service: [name], [name] 01/11/2020 10:30 AM Medical Record [id number] Patient Account Number: [id number] Date of Birth/Sex: Treating RN: 11-Feb-1941 (79 y.o. Jimmy Blair) Carlene Coria Primary Care Jshon Ibe: Dion Body Other Clinician: Referring Jaloni Davoli: Treating  Tucker Steedley/Extender:Robson, Parks Neptune, Dirk Dress in Treatment: 0 Fall Risk Assessment Items Have you had 2 or more falls in the last 12 monthso 0 No Have you had any fall that resulted in injury in the last 12 monthso 0 No FALLS RISK SCREEN History of falling - immediate or within 3 months 0 No Secondary diagnosis (Do you have 2 or more medical diagnoseso) 0 No Ambulatory aid None/bed rest/wheelchair/nurse 0 No Crutches/cane/walker 0 No Furniture 0 No Intravenous therapy Access/Saline/Heparin Lock 0 No Weak (short steps with or without shuffle, stooped but able to lift head 0 No while walking, may seek support from furniture) Impaired (short steps with shuffle, may have difficulty arising from chair, 0 No  head down, impaired balance) Mental Status Oriented to own ability 0 No Overestimates or forgets limitations 0 No Risk Level: Low Risk Score: 0 Electronic Signature(s) Signed: 01/11/2020 5:30:50 PM By: Carlene Coria RN Entered By: Carlene Coria on 01/11/2020 11:31:22 -------------------------------------------------------------------------------- Foot Assessment Details Patient Name: Date of Service: [name], [name] 01/11/2020 10:30 AM Medical Record [id number] Patient Account Number: [id number] Date of Birth/Sex: Treating RN: April 18, 1941 (79 y.o. Jimmy Blair) Carlene Coria Primary Care Geraldine Tesar: Dion Body Other Clinician: Referring Rogan Wigley: Treating Tamani Durney/Extender:Robson, Parks Neptune, Dirk Dress in Treatment: 0 Foot Assessment Items Site Locations + = Sensation present, - = Sensation absent, C = Callus, U = Ulcer R = Redness, W = Warmth, M = Maceration, PU = Pre-ulcerative lesion F = Fissure, S = Swelling, D = Dryness Assessment Right: Left: Other Deformity: No No Prior Foot Ulcer: No No Prior Amputation: No No Charcot Joint: No No Ambulatory Status: Ambulatory Without Help Gait: Steady Electronic Signature(s) Signed: 01/11/2020 5:30:50 PM By:  Carlene Coria RN Entered By: Carlene Coria on 01/11/2020 11:38:20 -------------------------------------------------------------------------------- Nutrition Risk Screening Details Patient Name: Date of Service: BELA, SOKOLOFF 01/11/2020 10:30 AM Medical Record [id number] Patient Account Number: [id number] Date of Birth/Sex: Treating RN: [birth year] (78 y.o. Jimmy Blair) Carlene Coria Primary Care Audris Speaker: Dion Body Other Clinician: Referring Savannha Welle: Treating Jaela Yepez/Extender:Robson, Parks Neptune, Dirk Dress in Treatment: 0 Height (in): 72 Weight (lbs): 165 Body Mass Index (BMI): 22.4 Nutrition Risk Screening Items Score Screening NUTRITION RISK SCREEN: I have an illness or condition that made me change the kind and/or 0 No amount of food I eat I eat fewer than two meals per day 0 No I eat few fruits and vegetables, or milk products 0 No I have three or more drinks of beer, liquor or wine almost every day 0 No I have tooth or mouth problems that make it hard for me to eat 0 No I don't always have enough money to buy the food I need 0 No I eat alone most of the time 0 No I take three or more different prescribed or over-the-counter drugs a day 1 Yes 0 No Without wanting to, I have lost or gained 10 pounds in the last six months I am not always physically able to shop, cook and/or feed myself 2 Yes Nutrition Protocols Good Risk Protocol Provide education on Moderate Risk Protocol 0 nutrition High Risk Proctocol Risk Level: Moderate Risk Score: 3 Electronic Signature(s) Signed: 01/11/2020 5:30:50 PM By: Carlene Coria RN Entered By: Carlene Coria on 01/11/2020 11:31:50

## 2020-01-14 NOTE — Progress Notes (Addendum)
Jimmy Blair, Jimmy Blair ([id number]) Visit Report for 01/11/2020 Allergy List Details Patient Name: Date of Service: Jimmy Blair, Jimmy Blair 01/11/2020 10:30 Jimmy Blair Medical Record [id number] Patient Account Number: [id number] Date of Birth/Sex: Treating RN: [birth year] (79 y.o. Male) Carlene Coria Primary Care Keira Bohlin: Dion Body Other Clinician: Referring Isaih Bulger: Treating Rhet Rorke/Extender:Robson, Parks Neptune, Dirk Dress in Treatment: 0 Allergies Active Allergies No Known Allergies Allergy Notes Electronic Signature(s) Signed: 01/11/2020 5:30:50 PM By: Carlene Coria RN Entered By: Carlene Coria on 01/11/2020 11:12:21 -------------------------------------------------------------------------------- Arrival Information Details Patient Name: Date of Service: Jimmy Blair, Jimmy Blair 01/11/2020 10:30 Jimmy Blair Medical Record [id number] Patient Account Number: [id number] Date of Birth/Sex: Treating RN: 07-02-1941 (79 y.o. Male) Carlene Coria Primary Care Tripp Goins: Dion Body Other Clinician: Referring Elsia Lasota: Treating Morgon Pamer/Extender:Robson, Parks Neptune, Dirk Dress in Treatment: 0 Visit Information Patient Arrived: Walker Arrival Time: 11:10 Accompanied By: self Transfer Assistance: None Patient Identification Verified: Yes Secondary Verification Process Completed: Yes Patient Requires Transmission-Based No Precautions: Patient Has Alerts: No Electronic Signature(s) Signed: 01/11/2020 5:30:50 PM By: Carlene Coria RN Entered By: Carlene Coria on 01/11/2020 11:11:26 -------------------------------------------------------------------------------- Clinic Level of Care Assessment Details Patient Name: Date of Service: Jimmy Blair, Jimmy Blair 01/11/2020 10:30 Jimmy Blair Medical Record [id number] Patient Account Number: [id number] Date of Birth/Sex: Treating RN: 1940-11-18 (79 y.o. Male) Kela Millin Primary Care Landyn Lorincz: Dion Body Other Clinician: Referring  Kazzandra Desaulniers: Treating Kaidin Boehle/Extender:Robson, Parks Neptune, Dirk Dress in Treatment: 0 Clinic Level of Care Assessment Items TOOL 1 Quantity Score X - Use when EandM and Procedure is performed on INITIAL visit 1 0 ASSESSMENTS - Nursing Assessment / Reassessment X - General Physical Exam (combine w/ comprehensive assessment (listed just below) 1 20 when performed on new pt. evals) X - Comprehensive Assessment (HX, ROS, Risk Assessments, Wounds Hx, etc.) 1 25 ASSESSMENTS - Wound and Skin Assessment / Reassessment []  - Dermatologic / Skin Assessment (not related to wound area) 0 ASSESSMENTS - Ostomy and/or Continence Assessment and Care []  - Incontinence Assessment and Management 0 []  - Ostomy Care Assessment and Management (repouching, etc.) 0 PROCESS - Coordination of Care []  - Simple Patient / Family Education for ongoing care 0 X - Complex (extensive) Patient / Family Education for ongoing care 1 20 X - Staff obtains Programmer, systems, Records, Test Results / Process Orders 1 10 X - Staff telephones HHA, Nursing Homes / Clarify orders / etc 1 10 []  - Routine Transfer to another Facility (non-emergent condition) 0 []  - Routine Hospital Admission (non-emergent condition) 0 X - New Admissions / Biomedical engineer / Ordering NPWT, Apligraf, etc. 1 15 []  - Emergency Hospital Admission (emergent condition) 0 PROCESS - Special Needs []  - Pediatric / Minor Patient Management 0 []  - Isolation Patient Management 0 []  - Hearing / Language / Visual special needs 0 X - Assessment of Community assistance (transportation, D/C planning, etc.) 1 15 []  - Additional assistance / Altered mentation 0 []  - Support Surface(s) Assessment (bed, cushion, seat, etc.) 0 INTERVENTIONS - Miscellaneous []  - External ear exam 0 []  - Patient Transfer (multiple staff / Civil Service fast streamer / Similar devices) 0 []  - Simple Staple / Suture removal (25 or less) 0 []  - Complex Staple / Suture removal (26 or more) 0 []  -  Hypo/Hyperglycemic Management (do not check if billed separately) 0 X - Ankle / Brachial Index (ABI) - do not check if billed separately 1 15 Has the patient been seen at the hospital within the last three years: Yes Total Score: 130 Level Of Care: New/Established - Level 4 Electronic Signature(s) Signed: 01/11/2020 5:31:36 PM By: Verita Schneiders,  Larene Beach Entered By: Kela Millin on 01/11/2020 12:47:25 -------------------------------------------------------------------------------- Compression Therapy Details Patient Name: Date of Service: Jimmy Blair, Jimmy Blair 01/11/2020 10:30 Jimmy Blair Medical Record [id number] Patient Account Number: [id number] Date of Birth/Sex: 1941/06/11 (78 y.o. Male) Treating RN: Kela Millin Primary Care Porschia Willbanks: Dion Body Other Clinician: Referring Kainoa Swoboda: Treating Brandom Kerwin/Extender:Robson, Parks Neptune, Dirk Dress in Treatment: 0 Compression Therapy Performed for Wound Wound #1 Left,Dorsal Foot Assessment: Performed By: Clinician Baruch Gouty, RN Compression Type: Three Layer Post Procedure Diagnosis Same as Pre-procedure Electronic Signature(s) Signed: 01/11/2020 5:31:36 PM By: Kela Millin Entered By: Kela Millin on 01/11/2020 12:45:22 -------------------------------------------------------------------------------- Encounter Discharge Information Details Patient Name: Date of Service: Jimmy Blair, Jimmy Blair 01/11/2020 10:30 Jimmy Blair Medical Record [id number] Patient Account Number: [id number] Date of Birth/Sex: Treating RN: [birth year] (79 y.o. Male) Baruch Gouty Primary Care Calee Nugent: Dion Body Other Clinician: Referring Troyce Gieske: Treating Jermiah Soderman/Extender:Robson, Parks Neptune, Dirk Dress in Treatment: 0 Encounter Discharge Information Items Post Procedure Vitals Discharge Condition: Stable Temperature (F): 98.2 Ambulatory Status: Walker Pulse (bpm): 69 Discharge Destination: Home Respiratory Rate  (breaths/min): 18 Transportation: Private Auto Blood Pressure (mmHg): 125/61 Accompanied By: caregiver Schedule Follow-up Appointment: Yes Clinical Summary of Care: Patient Declined Electronic Signature(s) Signed: 01/11/2020 5:31:10 PM By: Baruch Gouty RN, BSN Entered By: Baruch Gouty on 01/11/2020 12:24:37 -------------------------------------------------------------------------------- Lower Extremity Assessment Details Patient Name: Date of Service: Jimmy Blair, Jimmy Blair 01/11/2020 10:30 Jimmy Blair Medical Record [id number] Patient Account Number: [id number] Date of Birth/Sex: Treating RN: Jan 31, 1941 (79 y.o. Male) Carlene Coria Primary Care Tim Wilhide: Dion Body Other Clinician: Referring Perris Conwell: Treating Lus Kriegel/Extender:Robson, Parks Neptune, Dirk Dress in Treatment: 0 Edema Assessment Assessed: [Left: No] [Right: No] E[Left: dema] [Right: :] Calf Left: Right: Point of Measurement: 44 cm From Medial Instep 42 cm cm Ankle Left: Right: Point of Measurement: 11 cm From Medial Instep 29 cm cm Vascular Assessment Blood Pressure: Brachial: [Left:125] Ankle: [Left:Dorsalis Pedis: 138 1.10] Electronic Signature(s) Signed: 01/11/2020 5:30:50 PM By: Carlene Coria RN Entered By: Carlene Coria on 01/11/2020 11:46:36 -------------------------------------------------------------------------------- Multi Wound Chart Details Patient Name: Date of Service: Jimmy Blair, Jimmy Blair 01/11/2020 10:30 Jimmy Blair Medical Record [id number] Patient Account Number: [id number] Date of Birth/Sex: Treating RN: [birth year] (79 y.o. Male) Kela Millin Primary Care Caleyah Jr: Dion Body Other Clinician: Referring Sarye Kath: Treating Jenkins Risdon/Extender:Robson, Parks Neptune, Dirk Dress in Treatment: 0 Vital Signs Height(in): 72 Pulse(bpm): 49 Weight(lbs): 165 Blood Pressure(mmHg): 125/61 Body Mass Index(BMI): 22 Temperature(F):  98.2 Respiratory 18 Rate(breaths/min): Photos: [1:No Photos] [N/A:N/A] Wound Location: [1:Left Foot - Dorsal] [N/A:N/A] Wounding Event: [1:Gradually Appeared] [N/A:N/A] Primary Etiology: [1:Pressure Ulcer] [N/A:N/A] Comorbid History: [1:Hypertension] [N/A:N/A] Date Acquired: [1:10/02/2019] [N/A:N/A] Weeks of Treatment: [1:0] [N/A:N/A] Wound Status: [1:Open] [N/A:N/A] Measurements L x W x D [1:1.5x3.5x0.3] [N/A:N/A] (cm) Area (cm) : [1:4.123] [N/A:N/A] Volume (cm) : [1:1.237] [N/A:N/A] % Reduction in Area: [1:0.00%] [N/A:N/A] % Reduction in Volume: [1:0.00%] [N/A:N/A] 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] [N/A:N/A] Exudate Amount: [1:Medium] [N/A:N/A] Exudate Type: [1:Serosanguineous] [N/A:N/A] Exudate Color: [1:red, brown] [N/A:N/A] Granulation Amount: [1:Medium (34-66%)] [N/A:N/A] Granulation Quality: [1:Red] [N/A:N/A] Necrotic Amount: [1:Medium (34-66%)] [N/A:N/A] Necrotic Tissue: [1:Eschar, Adherent Slough] [N/A:N/A] Exposed Structures: [1:Fat Layer (Subcutaneous Tissue) Exposed: Yes Fascia: No Tendon: No Muscle: No Joint: No Bone: No] [N/A:N/A] Epithelialization: [1:None] [N/A:N/A] Debridement: [1:Debridement - Excisional] [N/A:N/A] Pre-procedure [1:12:05] [N/A:N/A] Verification/Time Out Taken: Pain Control: [1:Other] [N/A:N/A] Tissue Debrided: [1:Subcutaneous, Slough] [N/A:N/A] Level: [1:Skin/Subcutaneous Tissue] [N/A:N/A] Debridement Area (sq cm):5.25 [N/A:N/A] Instrument: [1:Curette] [N/A:N/A] Bleeding: [1:Minimum] [N/A:N/A] Hemostasis Achieved: [1:Pressure] [N/A:N/A] Procedural Pain: [1:0] [N/A:N/A] Post Procedural Pain: [1:0] [N/A:N/A] Debridement Treatment Procedure was tolerated [N/A:N/A] Response: [1:well] Post Debridement [1:1.5x3.5x0.3] [N/A:N/A] Measurements L x W x D (  cm) Post Debridement [1:1.237] [N/A:N/A] Volume: (cm) Post Debridement Stage: Category/Stage III [N/A:N/A N/A] Treatment Notes Wound #1 (Left, Dorsal Foot) 2. Periwound  Care Moisturizing lotion 3. Primary Dressing Applied Iodoflex 4. Secondary Dressing Dry Gauze 6. Support Layer Applied 3 layer compression wrap Electronic Signature(s) Signed: 01/11/2020 5:31:36 PM By: Kela Millin Signed: 01/14/2020 8:43:01 Jimmy Blair By: Linton Ham MD Entered By: Linton Ham on 01/11/2020 12:28:15 -------------------------------------------------------------------------------- Multi-Disciplinary Care Plan Details Patient Name: Date of Service: Jimmy Blair, Jimmy Blair 01/11/2020 10:30 Jimmy Blair Medical Record [id number] Patient Account Number: [id number] Date of Birth/Sex: Treating RN: 01/28/1941 (79 y.o. Male) Kela Millin Primary Care Aslan Himes: Dion Body Other Clinician: Referring Ashwini Jago: Treating Esperansa Sarabia/Extender:Robson, Parks Neptune, Dirk Dress in Treatment: 0 Active Inactive Pressure Nursing Diagnoses: Knowledge deficit related to causes and risk factors for pressure ulcer development Knowledge deficit related to management of pressures ulcers Goals: Patient/caregiver will verbalize risk factors for pressure ulcer development Date Initiated: 01/11/2020 Target Resolution Date: 02/15/2020 Goal Status: Active Interventions: Provide education on pressure ulcers Notes: Wound/Skin Impairment Nursing Diagnoses: Impaired tissue integrity Goals: Ulcer/skin breakdown will have a volume reduction of 30% by week 4 Date Initiated: 01/11/2020 Target Resolution Date: 02/15/2020 Goal Status: Active Interventions: Provide education on ulcer and skin care Notes: Electronic Signature(s) Signed: 01/11/2020 5:31:36 PM By: Kela Millin Entered By: Kela Millin on 01/11/2020 12:46:07 -------------------------------------------------------------------------------- Pain Assessment Details Patient Name: Date of Service: Jimmy Blair, Jimmy Blair 01/11/2020 10:30 Jimmy Blair Medical Record [id number] Patient Account Number: [id number] Date of  Birth/Sex: Treating RN: [birth year] (79 y.o. Male) Carlene Coria Primary Care Geron Mulford: Dion Body Other Clinician: Referring Ashlyne Olenick: Treating [place]e/Extender:Robson, Parks Neptune, Dirk Dress in Treatment: 0 Active Problems Location of Pain Severity and Description of Pain Patient Has Paino Yes Patient Has Paino Yes Site Locations Pain Location: Pain in Ulcers With Dressing Change: Yes Duration of the Pain. Constant / Intermittento Constant Rate the pain. Current Pain Level: 3 Worst Pain Level: 5 Least Pain Level: 0 Tolerable Pain Level: 5 Character of Pain Describe the Pain: Burning Pain Management and Medication Current Pain Management: Medication: No Cold Application: No Rest: Yes Massage: No Activity: No T.E.N.S.: No Heat Application: No Leg drop or elevation: No Is the Current Pain Management Adequate: Inadequate How does your wound impact your activities of daily livingo Sleep: No Bathing: No Appetite: No Relationship With Others: No Bladder Continence: No Emotions: No Bowel Continence: No Work: No Toileting: No Drive: No Dressing: No Hobbies: No Electronic Signature(s) Signed: 01/11/2020 5:30:50 PM By: Carlene Coria RN Entered By: Carlene Coria on 01/11/2020 11:50:39 -------------------------------------------------------------------------------- Patient/Caregiver Education Details Patient Name: Jimmy Blair 3/12/2021andnbsp10:30 Date of Service: Jimmy Blair Medical Record [id number] Number: Patient Account Number: [id number] Treating RN: Date of Birth/Gender: Dec 15, 1940 (78 y.o. Kela Millin Male) Other Clinician: Primary Care Physician: Dion Body Treating Linton Ham Referring Physician: Physician/Extender: Abelardo Diesel in Treatment: 0 Education Assessment Education Provided To: Patient and Caregiver Education Topics Provided Pressure: Methods: Explain/Verbal Responses: State content  correctly Wound/Skin Impairment: Methods: Explain/Verbal Responses: State content correctly Electronic Signature(s) Signed: 01/11/2020 5:31:36 PM By: Kela Millin Entered By: Kela Millin on 01/11/2020 12:46:22 -------------------------------------------------------------------------------- Wound Assessment Details Patient Name: Date of Service: Jimmy Blair, Jimmy Blair 01/11/2020 10:30 Jimmy Blair Medical Record [id number] Patient Account Number: [id number] Date of Birth/Sex: Treating RN: Jul 23, 1941 (79 y.o. Male) Kela Millin Primary Care Ashantia Amaral: Dion Body Other Clinician: Referring Brayson Livesey: Treating Clara Smolen/Extender:Robson, Parks Neptune, Dirk Dress in Treatment: 0 Wound Status Wound Number: 1 Primary Etiology: Pressure Ulcer Wound Location: Left Foot - Dorsal Wound Status: Open Wounding Event: Gradually Appeared Comorbid History: Hypertension  Date Acquired: 10/02/2019 Weeks Of Treatment: 0 Clustered Wound: No Photos Wound Measurements Length: (cm) 1.5 % Reducti Width: (cm) 3.5 % Reducti Depth: (cm) 0.3 Epithelia Area: (cm) 4.123 Tunnelin Volume: (cm) 1.237 Undermin Wound Description Classification: Category/Stage III Exudate Amount: Medium Exudate Type: Serosanguineous Exudate Color: red, brown Wound Bed Granulation Amount: Medium (34-66%) Granulation Quality: Red Necrotic Amount: Medium (34-66%) Necrotic Quality: Eschar, Adherent Slough Foul Odor After Cleansing: No Slough/Fibrino Yes Exposed Structure Fascia Exposed: No Fat Layer (Subcutaneous Tissue) Exposed: Yes Tendon Exposed: No Muscle Exposed: No Joint Exposed: No Bone Exposed: No on in Area: 0% on in Volume: 0% lization: None g: No ing: No Treatment Notes Wound #1 (Left, Dorsal Foot) 2. Periwound Care Moisturizing lotion 3. Primary Dressing Applied Iodoflex 4. Secondary Dressing Dry Gauze 6. Support Layer Applied 3 layer compression wrap Electronic  Signature(s) Signed: 01/14/2020 5:11:29 PM By: Mikeal Hawthorne EMT/HBOT Signed: 01/14/2020 5:25:45 PM By: Kela Millin Previous Signature: 01/11/2020 5:30:50 PM Version By: Carlene Coria RN Entered By: Mikeal Hawthorne on 01/14/2020 11:59:42 -------------------------------------------------------------------------------- Vitals Details Patient Name: Date of Service: Jimmy Blair, Jimmy Blair 01/11/2020 10:30 Jimmy Blair Medical Record [id number] Patient Account Number: [id number] Date of Birth/Sex: Treating RN: Jan 22, 1941 (79 y.o. Male) Carlene Coria Primary Care Maresa Morash: Dion Body Other Clinician: Referring Maricruz Lucero: Treating Brenleigh Collet/Extender:Robson, Parks Neptune, Dirk Dress in Treatment: 0 Vital Signs Time Taken: 11:11 Temperature (F): 98.2 Height (in): 72 Pulse (bpm): 69 Source: Stated Respiratory Rate (breaths/min): 18 Weight (lbs): 165 Blood Pressure (mmHg): 125/61 Source: Stated Reference Range: 80 - 120 mg / dl Body Mass Index (BMI): 22.4 Electronic Signature(s) Signed: 01/11/2020 5:30:50 PM By: Carlene Coria RN Entered By: Carlene Coria on 01/11/2020 11:12:09

## 2020-01-14 NOTE — Progress Notes (Signed)
Jimmy Blair ([id number]) Visit Report for 01/11/2020 Chief Complaint Document Details Patient Name: Date of Service: Jimmy Blair 01/11/2020 10:30 AM Medical Record [id number] Patient Account Number: [id number] Date of Birth/Sex: Treating RN: 21-Jun-1941 (79 y.o. Marvis Repress Primary Care Provider: Dion Body Other Clinician: Referring Provider: Treating Provider/Extender:Calene Paradiso, Parks Neptune, Dirk Dress in Treatment: 0 Information Obtained from: Patient Chief Complaint 01/11/2020; this is a patient who was previously seen once in our sister clinic in [place] on 12/06/2019 with a wound on his left dorsal foot Electronic Signature(s) Signed: 01/14/2020 8:43:01 AM By: Linton Ham MD Entered By: Linton Ham on 01/11/2020 12:29:15 -------------------------------------------------------------------------------- Debridement Details Patient Name: Date of Service: Jimmy Blair 01/11/2020 10:30 AM Medical Record [id number] Patient Account Number: [id number] Date of Birth/Sex: Treating RN: Nov 26, 1940 (79 y.o. Marvis Repress Primary Care Provider: Dion Body Other Clinician: Referring Provider: Treating Provider/Extender:Jatia Musa, Parks Neptune, Dirk Dress in Treatment: 0 Debridement Performed for Wound #1 Left,Dorsal Foot Assessment: Performed By: Physician Ricard Dillon., MD Debridement Type: Debridement Level of Consciousness (Pre- Awake and Alert procedure): Pre-procedure Verification/Time Out Taken: Yes - 12:05 Start Time: 12:05 Pain Control: Other : benzocaine 20% Total Area Debrided (L x W): 1.5 (cm) x 3.5 (cm) = 5.25 (cm) Tissue and other material Viable, Non-Viable, Slough, Subcutaneous, Slough debrided: Level: Skin/Subcutaneous Tissue Debridement Description: Excisional Instrument: Curette Bleeding: Minimum Hemostasis Achieved: Pressure End Time: 12:06 Procedural Pain: 0 Post Procedural Pain:  0 Response to Treatment: Procedure was tolerated well Level of Consciousness Awake and Alert (Post-procedure): Post Debridement Measurements of Total Wound Length: (cm) 1.5 Stage: Category/Stage III Width: (cm) 3.5 Depth: (cm) 0.3 Volume: (cm) 1.237 Character of Wound/Ulcer Post Improved Debridement: Post Procedure Diagnosis Same as Pre-procedure Electronic Signature(s) Signed: 01/11/2020 5:31:36 PM By: Kela Millin Signed: 01/14/2020 8:43:01 AM By: Linton Ham MD Entered By: Linton Ham on 01/11/2020 12:28:43 -------------------------------------------------------------------------------- HPI Details Patient Name: Date of Service: Jimmy Blair 01/11/2020 10:30 AM Medical Record [id number] Patient Account Number: [id number] Date of Birth/Sex: Treating RN: 02-26-1941 (79 y.o. Marvis Repress Primary Care Provider: Dion Body Other Clinician: Referring Provider: Treating Provider/Extender:Roslynn Holte, Parks Neptune, Dirk Dress in Treatment: 0 History of Present Illness HPI Description: [name] HPI Description: 12/06/2019 upon evaluation today patient presents for initial evaluation here in our clinic concerning issues that he is having with his dorsal foot on the left. This secondary to a excessive swelling that occurred pushing on his shoe causing a pressure breakdown. He does have known venous stasis which I think is one of the main issues that has led to the initial swelling and then pressure injury. Nonetheless he seems to be showing some signs of improvement although he still has significant lymphedema and venous stasis as well. He has previously been recommended by vascular to have lymphedema pumps as well as compression stockings they were also rapid him. He will not however wear the compression stockings according to his daughter he also would not use the lymphedema pumps. He told them to send them back. Likewise anytime they wrapped  him she states that she will be driving back home and he will be on rapid on the way back home. Obviously I think this is good to be somewhat a compliance issue when it comes to the edema control especially if we end up needing the wrap the patient. It is possible he may heal without the need for wrapping but at the same time does not [id number] certain. If he is not showing signs of good healing and improvement then we will  need to consider initiating compression wrapping. The patient otherwise does have a history of again venous insufficiency, lymphedema, and mild cognitive impairment which I think is part of the reason that he is having some issues with compliance most likely. The patient does seem to be pleasant but states that he does not like the compression socks at all. ADMISSION 01/11/2020 This is an 79 year old man who was seen on in our sister clinic in [place] by Jeri Cos on 2/4; he has a wound on his left dorsal foot which is not healed. He has since moved to [place] which is an assisted living in [place] and so he comes to the clinic here today. By review of the records in [place] he has a history of chronic venous insufficiency and lymphedema. His daughter told our intake nurse that he had had a DVT in December 2020 and is now on Xarelto but I do not have any collaborating information on this. I also noticed that there is issues with regards to compliance with wraps although I was not aware of this when I saw the patient. Nevertheless I do not think it would change the issue he is going to need to be wrapped the heel this wound and to maintain skin integrity. Past medical history includes lymphedema, hypertension, CVA, DVT in December 2020. ABI in our clinic was 1.11 on the left Electronic Signature(s) Signed: 01/14/2020 8:43:01 AM By: Linton Ham MD Entered By: Linton Ham on 01/11/2020  12:33:40 -------------------------------------------------------------------------------- Physical Exam Details Patient Name: Date of Service: Jimmy Blair 01/11/2020 10:30 AM Medical Record [id number] Patient Account Number: [id number] Date of Birth/Sex: Treating RN: 12-26-1940 (79 y.o. Marvis Repress Primary Care Provider: Dion Body Other Clinician: Referring Provider: Treating Provider/Extender:Josehua Hammar, Parks Neptune, Dirk Dress in Treatment: 0 Constitutional Sitting or standing Blood Pressure is within target range for patient.. Pulse regular and within target range for patient.Marland Kitchen Respirations regular, non-labored and within target range.. Temperature is normal and within the target range for the patient.Marland Kitchen Appears in no distress. Respiratory work of breathing is normal. Cardiovascular Pedal pulses are palpable on the left.. He has very significant left lower extremity swelling extending into his dorsal foot into the upper leg. There is no calf tenderness no warmth. Integumentary (Hair, Skin) Skin changes of chronic venous insufficiency no evidence of cellulitis. Notes Wound exam; the area in question is on the left dorsal foot. Significant amount of adherent debris which I removed with a #3 curette. Hemostasis with direct pressure. There is no evidence of infection around the wound Electronic Signature(s) Signed: 01/14/2020 8:43:01 AM By: Linton Ham MD Entered By: Linton Ham on 01/11/2020 12:34:56 -------------------------------------------------------------------------------- Physician Orders Details Patient Name: Date of Service: HOOVER, VANECK 01/11/2020 10:30 AM Medical Record [id number] Patient Account Number: [id number] Date of Birth/Sex: Treating RN: 12/14/1940 (78 y.o. Marvis Repress Primary Care Provider: Dion Body Other Clinician: Referring Provider: Treating Provider/Extender:Lavine Hargrove, Parks Neptune,  Dirk Dress in Treatment: 0 Verbal / Phone Orders: No Diagnosis Coding Follow-up Appointments Wound #1 Left,Dorsal Foot Return Appointment in 1 week. Dressing Change Frequency Other: - Twice a week, once by wound care center (Fridays) and once by [facility] (Tuesday) Skin Barriers/Peri-Wound Care Wound #1 Left,Dorsal Foot Moisturizing lotion Wound Cleansing Wound #1 Left,Dorsal Foot May shower with protection. May shower and wash wound with soap and water. - on days that [facility] is changing the dressing Primary Wound Dressing Wound #1 Left,Dorsal Foot Iodoflex Secondary Dressing Wound #1 Left,Dorsal Foot Dry Gauze Edema Control 3 Layer Compression  System - Left Lower Extremity Avoid standing for long periods of time Elevate legs to the level of the heart or above for 30 minutes daily and/or when sitting, a frequency of: [place] [facility], Wound Care Dressing Change Electronic Signature(s) Signed: 01/11/2020 5:31:36 PM By: Kela Millin Signed: 01/14/2020 8:43:01 AM By: Linton Ham MD Entered By: Kela Millin on 01/11/2020 12:17:27 -------------------------------------------------------------------------------- Problem List Details Patient Name: Date of Service: ARTA, BAZ 01/11/2020 10:30 AM Medical Record [id number] Patient Account Number: [id number] Date of Birth/Sex: Treating RN: 19-Nov-1940 (79 y.o. 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/2020 No Yes with fat layer exposed Z86.718 Personal history of other venous  thrombosis and 01/11/2020 No Yes embolism I89.0 Lymphedema, not elsewhere classified 01/11/2020 No Yes Inactive Problems Resolved Problems Electronic Signature(s) Signed: 01/14/2020 8:43:01 AM By: Linton Ham MD Entered By: Linton Ham on 01/11/2020 12:39:28 -------------------------------------------------------------------------------- Progress Note Details Patient Name: Date of Service: JAIMON, LOFGREEN 01/11/2020 10:30 AM Medical Record [id number] Patient Account Number: [id number] Date of Birth/Sex: Treating RN: 03/02/1941 (79 y.o. Marvis Repress Primary Care Provider: Other Clinician: Dion Body Referring Provider: Treating Provider/Extender:Edker Punt, Parks Neptune, Dirk Dress in Treatment: 0 Subjective Chief Complaint Information obtained from Patient 01/11/2020; this is a patient who was previously seen once in our sister clinic in [place] on 12/06/2019 with a wound on his left dorsal foot History of Present Illness (HPI) [place] HPI Description: 12/06/2019 upon evaluation today patient presents for initial evaluation here in our clinic concerning issues that he is having with his dorsal foot on the left. This secondary to a excessive swelling that occurred pushing on his shoe causing a pressure breakdown. He does have known venous stasis which I think is one of the main issues that has led to the initial swelling and then pressure injury. Nonetheless he seems to be showing some signs of improvement although he still has significant lymphedema and venous stasis as well. He has previously been recommended by vascular to have lymphedema pumps as well as compression stockings they were also rapid him. He will not however wear the compression stockings according to his daughter he also would not use the lymphedema pumps. He told them to send them back. Likewise anytime they wrapped him she states that she will be driving back home and he will be on  rapid on the way back home. Obviously I think this is good to be somewhat a compliance issue when it comes to the edema control especially if we end up needing the wrap the patient. It is possible he may heal without the need for wrapping but at the same time does not [id number] certain. If he is not showing signs of good healing and improvement then we will need to consider initiating compression wrapping. The patient otherwise does have a history of again venous insufficiency, lymphedema, and mild cognitive impairment which I think is part of the reason that he is having some issues with compliance most likely. The patient does seem to be pleasant but states that he does not like the compression socks at all. ADMISSION 01/11/2020 This is an 79 year old man who was seen on in our sister clinic in [place] by [place]  [facility] on 2/4; he has a wound on his left dorsal foot which is not healed. He has since moved to [place] which is an assisted living in [facility] and so he comes to the clinic here today. By review of the records in [place] he has a history of chronic venous insufficiency and lymphedema. His daughter told our intake nurse that he had had a DVT in December 2020 and is now on Xarelto but I do not have any collaborating information on this. I also noticed that there is issues with regards to compliance with wraps although I was not aware of this when I saw the patient. Nevertheless I do not think it would change the issue he is going to need to be wrapped the heel this wound and to maintain skin integrity. Past medical history includes lymphedema, hypertension, CVA, DVT in December 2020. ABI in our clinic was 1.11 on the left Patient History Information obtained from Patient. Allergies No Known Allergies Family History No family history of Cancer, Diabetes, Heart Disease, Hereditary Spherocytosis, Hypertension, Kidney Disease, Lung Disease, Seizures, Stroke, Thyroid  Problems, Tuberculosis. Social History Never smoker, Marital Status - Separated, Alcohol Use - Rarely, Drug Use - No History, Caffeine Use - Daily. Medical History Eyes Denies history of Cataracts, Glaucoma, Optic Neuritis Ear/Nose/Mouth/Throat Denies history of Chronic sinus problems/congestion, Middle ear problems Hematologic/Lymphatic Denies history of Anemia, Hemophilia, Human Immunodeficiency Virus, Lymphedema, Sickle Cell Disease Respiratory Denies history of Aspiration, Asthma, Chronic Obstructive Pulmonary Disease (COPD), Pneumothorax, Sleep Apnea, Tuberculosis Cardiovascular Patient has history of Hypertension Denies history of Angina, Arrhythmia, Congestive Heart Failure, Coronary Artery Disease, Deep Vein Thrombosis, Hypotension, Myocardial Infarction, Peripheral Arterial Disease, Peripheral Venous Disease, Phlebitis, Vasculitis Gastrointestinal Denies history of Cirrhosis , Colitis, Crohnoos, Hepatitis A, Hepatitis B, Hepatitis C Endocrine Denies history of Type I Diabetes, Type II Diabetes Genitourinary Denies history of End Stage Renal Disease Immunological Denies history of Lupus Erythematosus, Raynaudoos, Scleroderma Integumentary (Skin) Denies history of History of Burn Musculoskeletal Denies history of Gout, Rheumatoid Arthritis, Osteoarthritis, Osteomyelitis Neurologic Denies history of Dementia, Neuropathy, Quadriplegia, Paraplegia, Seizure Disorder Oncologic Denies history of Received Chemotherapy, Received Radiation Psychiatric Denies history of Anorexia/bulimia, Confinement Anxiety Review of Systems (ROS) Constitutional Symptoms (General Health) Denies complaints or symptoms of Fatigue, Fever, Chills, Marked Weight Change. Eyes Denies complaints or symptoms of Dry Eyes, Vision Changes, Glasses / Contacts. Ear/Nose/Mouth/Throat Denies complaints or symptoms of Chronic sinus problems or rhinitis. Respiratory Denies complaints or symptoms of Chronic or  frequent coughs, Shortness of Breath. Cardiovascular Denies complaints or symptoms of Chest pain. Gastrointestinal Denies complaints or symptoms of Frequent diarrhea, Nausea, Vomiting. Endocrine Denies complaints or symptoms of Heat/cold intolerance. Genitourinary Denies complaints or symptoms of Frequent urination. Integumentary (Skin) Complains or has symptoms of Wounds. Musculoskeletal Denies complaints or symptoms of Muscle Pain, Muscle Weakness. Neurologic Denies complaints or symptoms of Numbness/parasthesias. Psychiatric Denies complaints or symptoms of Claustrophobia, Suicidal. Objective Constitutional Sitting or standing Blood Pressure is within target range for patient.. Pulse regular and within target range for patient.Marland Kitchen Respirations regular, non-labored and within target range.. Temperature is normal and within the target range for the patient.Marland Kitchen Appears in no distress. Vitals Time Taken: 11:11 AM, Height: 72 in, Source: Stated, Weight: 165 lbs, Source: Stated, BMI: 22.4, Temperature: 98.2 F, Pulse: 69 bpm, Respiratory Rate: 18 breaths/min, Blood Pressure: 125/61 mmHg. Respiratory work of breathing is normal. Cardiovascular Pedal pulses are palpable on the left.. He has very significant left lower extremity swelling extending into his dorsal foot  into the upper leg. There is no calf tenderness no warmth. General Notes: Wound exam; the area in question is on the left dorsal foot. Significant amount of adherent debris which I removed with a #3 curette. Hemostasis with direct pressure. There is no evidence of infection around the wound Integumentary (Hair, Skin) Skin changes of chronic venous insufficiency no evidence of cellulitis. Wound #1 status is Open. Original cause of wound was Gradually Appeared. The wound is located on the Left,Dorsal Foot. The wound measures 1.5cm length x 3.5cm width x 0.3cm depth; 4.123cm^2 area and 1.237cm^3 volume. There is Fat Layer  (Subcutaneous Tissue) Exposed exposed. There is no tunneling or undermining noted. There is a medium amount of serosanguineous drainage noted. There is medium (34-66%) red granulation within the wound bed. There is a medium (34-66%) amount of necrotic tissue within the wound bed including Eschar and Adherent Slough. 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 There was a Excisional Skin/Subcutaneous Tissue Debridement with a total area of 5.25 sq cm performed by Ricard Dillon., MD. With the following instrument(s): Curette to remove Viable and Non-Viable tissue/material. Material removed includes Subcutaneous Tissue and Slough and after achieving pain control using Other (benzocaine 20%). No specimens were taken. A time out was conducted at 12:05, prior to the start of the procedure. A Minimum amount of bleeding was controlled with Pressure. The procedure was tolerated well with a pain level of 0 throughout and a pain level of 0 following the procedure. Post Debridement Measurements: 1.5cm length x 3.5cm width x 0.3cm depth; 1.237cm^3 volume. Post debridement Stage noted as Category/Stage III. Character of Wound/Ulcer Post Debridement is improved. Post procedure Diagnosis Wound #1: Same as Pre-Procedure Plan Follow-up Appointments: Wound #1 Left,Dorsal Foot: Return Appointment in 1 week. Dressing Change Frequency: Other: - Twice a week, once by wound care center (Fridays) and once by [facility] (Tuesday) Skin Barriers/Peri-Wound Care: Wound #1 Left,Dorsal Foot: Moisturizing lotion Wound Cleansing: Wound #1 Left,Dorsal Foot: May shower with protection. May shower and wash wound with  soap and water. - on days that [facility] is changing the dressing Primary Wound Dressing: Wound #1 Left,Dorsal Foot: Iodoflex Secondary Dressing: Wound #1 Left,Dorsal Foot: Dry Gauze Edema Control: 3 Layer Compression System - Left Lower Extremity Avoid standing for long periods of time Elevate legs to the level of the heart or above for 30 minutes daily and/or when sitting, a frequency of: Home Health: Admit to [place] [facility], Wound Care Dressing Change 1. Left dorsal foot I agree with the Iodoflex that he was on. We are going to have to put him in compression. We chose 3 layer compression 2. He had support stockings on the leg when he came in these were stretched to the limit and they were not on properly. This would only make things worse. 3. He apparently has chronic edema in this leg perhaps lymphedema. A lot of this looks like chronic venous issues. He apparently has had a DVT in this leg but a recent lower extremity reflux study done on 05/14/2019 showed no evidence of a DVT. 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 I do not know where the history of a DVT in December came from 4. He apparently has been  noncompliant with wraps or stockings if I am reading [employer] note correctly. We will have to try and work this out going forward. There will be no way to heal this wound without compression wraps currently 5. We will follow up with him in a week Electronic Signature(s) Signed: 01/14/2020 8:43:01 AM By: Linton Ham MD Entered By: Linton Ham on 01/11/2020 12:38:02 -------------------------------------------------------------------------------- HxROS Details Patient Name: Date of Service: SHRAGA, KOWAL 01/11/2020 10:30 AM Medical Record [id number] Patient Account Number: [id number] Date of Birth/Sex: Treating RN: 03/25/1941 (78 y.o. Oval Linsey Primary Care Provider: Dion Body Other Clinician: Referring Provider: Treating  Provider/Extender:Dee Maday, Parks Neptune, Dirk Dress in Treatment: 0 Information Obtained From Patient Constitutional Symptoms (General Health) Complaints and Symptoms: Negative for: Fatigue; Fever; Chills; Marked Weight Change Eyes Complaints and Symptoms: Negative for: Dry Eyes; Vision Changes; Glasses / Contacts Medical History: Negative for: Cataracts; Glaucoma; Optic Neuritis Ear/Nose/Mouth/Throat Complaints and Symptoms: Negative for: Chronic sinus problems or rhinitis Medical History: Negative for: Chronic sinus problems/congestion; Middle ear problems Respiratory Complaints and Symptoms: Negative for: Chronic or frequent coughs; Shortness of Breath Medical History: Negative for: Aspiration; Asthma; Chronic Obstructive Pulmonary Disease (COPD); Pneumothorax; Sleep Apnea; Tuberculosis Cardiovascular Complaints and Symptoms: Negative for: Chest pain Medical History: Positive for: Hypertension Negative for: Angina; Arrhythmia; Congestive Heart Failure; Coronary Artery Disease; Deep Vein Thrombosis; Hypotension; Myocardial Infarction; Peripheral Arterial Disease; Peripheral Venous Disease; Phlebitis; Vasculitis Gastrointestinal Complaints and Symptoms: Negative for: Frequent diarrhea; Nausea; Vomiting Medical History: Negative for: Cirrhosis ; Colitis; Crohns; Hepatitis A; Hepatitis B; Hepatitis C Endocrine Complaints and Symptoms: Negative for: Heat/cold intolerance Medical History: Negative for: Type I Diabetes; Type II Diabetes Genitourinary Complaints and Symptoms: Negative for: Frequent urination Medical History: Negative for: End Stage Renal Disease Integumentary (Skin) Complaints and Symptoms: Positive for: Wounds Medical History: Negative for: History of Burn Musculoskeletal Complaints and Symptoms: Negative for: Muscle Pain; Muscle Weakness Medical History: Negative for: Gout; Rheumatoid Arthritis; Osteoarthritis;  Osteomyelitis Neurologic Complaints and Symptoms: Negative for: Numbness/parasthesias Medical History: Negative for: Dementia; Neuropathy; Quadriplegia; Paraplegia; Seizure Disorder Psychiatric Complaints and Symptoms: Negative for: Claustrophobia; Suicidal Medical History: Negative for: Anorexia/bulimia; Confinement Anxiety Hematologic/Lymphatic Medical History: Negative for: Anemia; Hemophilia; Human Immunodeficiency Virus; Lymphedema; Sickle Cell Disease Immunological Medical History: Negative for: Lupus Erythematosus; Raynauds; Scleroderma Oncologic Medical History: Negative for: Received Chemotherapy; Received Radiation Immunizations Pneumococcal Vaccine: Received Pneumococcal Vaccination: No Implantable Devices None Family and Social History Cancer: No; Diabetes: No; Heart Disease: No; Hereditary Spherocytosis: No; Hypertension: No; Kidney Disease: No; Lung Disease: No; Seizures: No; Stroke: No; Thyroid Problems: No; Tuberculosis: No; Never smoker; Marital Status - Separated; Alcohol Use: Rarely; Drug Use: No History; Caffeine Use: Daily; Financial Concerns: No; Food, Clothing or Shelter Needs: No; Support System Lacking: No; Transportation Concerns: No Electronic Signature(s) Signed: 01/11/2020 5:30:50 PM By: Carlene Coria RN Signed: 01/14/2020 8:43:01 AM By: Linton Ham MD Entered By: Carlene Coria on 01/11/2020 11:16:44 -------------------------------------------------------------------------------- SuperBill Details Patient Name: Date of Service: JACEION, HUESCA 01/11/2020 Medical Record [id number] Patient Account Number: [id number] Date of Birth/Sex: Treating RN: May 06, 1941 (79 y.o. 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 Z86.718 Personal history of other venous thrombosis and embolism Facility Procedures CPT4 Code Description: [id number] - WOUND CARE VISIT-LEV 4 EST PT Modifier: 25 Quantity:  1 CPT4 Code Description: [id number] - DEB SUBQ TISSUE 20 SQ CM/< ICD-10 Diagnosis Description I87.312 Chronic venous hypertension (idiopathic) with ulcer of left l L97.522 Non-pressure chronic ulcer of other part of left foot with fa Modifier: ower extrem t layer exp Quantity: 1 ity osed Physician Procedures CPT4 Code Description: [id number] - WC PHYS SUBQ TISS 20 SQ CM ICD-10 Diagnosis Description I87.312 Chronic venous hypertension (idiopathic) with ulcer of left l L97.522 Non-pressure chronic ulcer of other part of left foot with fa Modifier: ower extremi t layer expo Quantity: 1 ty sed Electronic Signature(s) Signed: 01/11/2020 5:31:36 PM By: Kela Millin Signed: 01/14/2020 8:43:01 AM By: Linton Ham MD Entered By: Kela Millin on 01/11/2020 12:47:39

## 2020-01-15 ENCOUNTER — Ambulatory Visit (INDEPENDENT_AMBULATORY_CARE_PROVIDER_SITE_OTHER): Payer: Medicare Other | Admitting: Nurse Practitioner

## 2020-01-18 ENCOUNTER — Encounter (HOSPITAL_BASED_OUTPATIENT_CLINIC_OR_DEPARTMENT_OTHER): Payer: Medicare Other | Admitting: Internal Medicine

## 2020-01-18 ENCOUNTER — Other Ambulatory Visit: Payer: Self-pay

## 2020-01-18 DIAGNOSIS — I87312 Chronic venous hypertension (idiopathic) with ulcer of left lower extremity: Secondary | ICD-10-CM | POA: Diagnosis not present

## 2020-01-18 NOTE — Progress Notes (Signed)
Jimmy, Blair ([id number]) Visit Report for 01/18/2020 Debridement Details Patient Name: Date of Service: Jimmy, Blair 01/18/2020 2:30 PM Medical Record [id number] Patient Account Number: [id number] Date of Birth/Sex: Treating RN: June 19, 1941 (79 y.o. Jimmy Blair Primary Care Provider: Dion Blair Other Clinician: Referring Provider: Treating Provider/Extender:Jimmy Blair, Jimmy Blair, Jimmy Blair in Treatment: 1 Debridement Performed for Wound #1 Left,Dorsal Foot Assessment: Performed By: Physician Jimmy Blair., MD Debridement Type: Debridement Level of Consciousness (Pre- Awake and Alert procedure): Pre-procedure Yes - 15:19 Verification/Time Out Taken: Start Time: 15:19 Pain Control: Other : benzocaine, 20% Total Area Debrided (L x W): 1.5 (cm) x 3.4 (cm) = 5.1 (cm) Tissue and other material Viable, Non-Viable, Subcutaneous debrided: Level: Skin/Subcutaneous Tissue Debridement Description: Excisional Instrument: Curette Bleeding: Minimum Hemostasis Achieved: Pressure End Time: 15:20 Procedural Pain: 0 Post Procedural Pain: 0 Response to Treatment: Procedure was tolerated well Level of Consciousness Awake and Alert (Post-procedure): Post Debridement Measurements of Total Wound Length: (cm) 1.5 Stage: Category/Stage III Width: (cm) 3.4 Depth: (cm) 0.1 Volume: (cm) 0.401 Character of Wound/Ulcer Post Improved Debridement: Post Procedure Diagnosis Same as Pre-procedure Electronic Signature(s) Signed: 01/18/2020 5:09:02 PM By: Jimmy Ham MD Signed: 01/18/2020 5:10:17 PM By: Jimmy Blair Entered By: Jimmy Blair on 01/18/2020 15:32:53 -------------------------------------------------------------------------------- HPI Details Patient Name: Date of Service: Jimmy, Blair 01/18/2020 2:30 PM Medical Record [id number] Patient Account Number: [id number] Date of Birth/Sex: Treating RN: 09/09/1941 (79 y.o. Jimmy Blair Primary Care Provider: Dion Blair Other Clinician: Referring Provider: Treating Provider/Extender:Jimmy Blair, Jimmy Blair, Jimmy Blair in Treatment: 1 History of Present Illness HPI Description: Jimmy Blair HPI Description: 12/06/2019 upon evaluation today patient presents for initial evaluation here in our clinic concerning issues that he is having with his dorsal foot on the left. This secondary to a excessive swelling that occurred pushing on his shoe causing a pressure breakdown. He does have known venous stasis which I think is one of the main issues that has led to the initial swelling and then pressure injury. Nonetheless he seems to be showing some signs of improvement although he still has significant lymphedema and venous stasis as well. He has previously been recommended by vascular to have lymphedema pumps as well as compression stockings they were also rapid him. He will not however wear the compression stockings according to his daughter he also would not use the lymphedema pumps. He told them to send them back. Likewise anytime they wrapped him she states that she will be driving back home and he will be on rapid on the way back home. Obviously I think this is good to be somewhat a compliance issue when it comes to the edema control especially if we end up needing the wrap the patient. It is possible he may heal without the need for wrapping but at the same time does not [id number] certain. If he is not showing signs of good healing and improvement then we will need to consider initiating compression wrapping. The patient otherwise does have a history of again venous insufficiency, lymphedema, and mild cognitive impairment which I think is part of the reason that he is having some issues with compliance most likely. The patient does seem to be pleasant but states that he does not like the compression socks at all. ADMISSION 01/11/2020 This is an 79 year old  man who was seen on in our sister clinic in [place] by Jimmy Blair on 2/4; he has a wound on his left dorsal foot which is not healed. He has since moved to [place]  which is an assisted living in [facility] and so he comes to the clinic here today. By review of the records in [place] he has a history of chronic venous insufficiency and lymphedema. His daughter told our intake nurse that he had had a DVT in December 2020 and is now on Xarelto but I do not have any collaborating information on this. I also noticed that there is issues with regards to compliance with wraps although I was not aware of this when I saw the patient. Nevertheless I do not think it would change the issue he is going to need to be wrapped the heel this wound and to maintain skin integrity. Past medical history includes lymphedema, hypertension, CVA, DVT in December 2020. ABI in our clinic was 1.11 on the left 3/19; patient who has a wound on the left dorsal foot. This looks better to me this week dimensions smaller still some adherent debris on the surface. We have been using Iodoflex under compression. Electronic Signature(s) Signed: 01/18/2020 5:09:02 PM By: Jimmy Ham MD Entered By: Jimmy Blair on 01/18/2020 15:35:05 -------------------------------------------------------------------------------- Physical Exam Details Patient Name: Date of Service: Jimmy, Blair 01/18/2020 2:30 PM Medical Record [id number] Patient Account Number: [id number] Date of Birth/Sex: Treating RN: 1941-05-09 (79 y.o. Jimmy Blair Primary Care Provider: Dion Blair Other Clinician: Referring Provider: Treating Provider/Extender:Lagena Strand, Jimmy Blair, Jimmy Blair in Treatment: 1 Constitutional Patient is hypertensive.. Pulse regular and within target range for patient.Marland Kitchen Respirations regular, non-labored and within target range.. Temperature is normal and within the target range for  the patient.Marland Kitchen Appears in no distress. Notes Wound exam; the area in question is on the left dorsal foot. Under illumination probably 80% tightly adherent debris. Debrided with a #3 curette. I think post debridement things look quite good. No evidence of surrounding infection. Electronic Signature(s) Signed: 01/18/2020 5:09:02 PM By: Jimmy Ham MD Entered By: Jimmy Blair on 01/18/2020 15:35:57 -------------------------------------------------------------------------------- Physician Orders Details Patient Name: Date of Service: Jimmy, Blair 01/18/2020 2:30 PM Medical Record [id number] Patient Account Number: [id number] Date of Birth/Sex: Treating RN: 06-Dec-1940 (79 y.o. 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 Dressing Change Frequency Other: - Twice a week, once by wound care center (Fridays) and once by [facility] (Tuesday) Skin Barriers/Peri-Wound Care Wound #1 Left,Dorsal Foot Moisturizing lotion Wound Cleansing Wound #1 Left,Dorsal Foot May shower with protection. May shower and wash wound with soap and water. - on days that [facility] is changing the dressing Primary Wound Dressing Wound #1 Left,Dorsal Foot Iodoflex Secondary Dressing Wound #1 Left,Dorsal Foot Dry Gauze Edema Control 3 Layer Compression System - Left Lower Extremity - use unna boot to top of lower leg to anchor wrap Avoid standing for  long periods of time Elevate legs to the level of the heart or above for 30 minutes daily and/or when sitting, a frequency of: [facility] skilled nursing for wound care. - [facility], Wound Care Dressing Change Electronic Signature(s) Signed: 01/18/2020 5:09:02 PM By: Jimmy Ham MD Signed: 01/18/2020 5:10:17 PM By: Jimmy Blair Entered By: Jimmy Blair on 01/18/2020 15:25:16 --------------------------------------------------------------------------------  Problem List Details Patient Name: Date of Service: Jimmy, Blair 01/18/2020 2:30 PM Medical Record [id number] Patient Account Number: [id number] Date of Birth/Sex: Treating RN: 04-12-1941 (79 y.o. Jimmy Blair Primary Care Provider: Dion Blair Other Clinician: Referring Provider: Treating Provider/Extender:Crissa Sowder, Jimmy Blair, Jimmy Blair in Treatment: 1 Active Problems ICD-10 Evaluated Encounter Code Description Active Date Today Diagnosis I87.312 Chronic venous hypertension (idiopathic) with ulcer of 01/11/2020 No Yes left lower extremity L97.522 Non-pressure chronic ulcer of other part of left foot 01/11/2020 No Yes with fat layer exposed Z86.718 Personal history of other venous thrombosis and 01/11/2020 No Yes embolism I89.0 Lymphedema, not elsewhere classified 01/11/2020 No Yes Inactive Problems Resolved Problems Electronic Signature(s) Signed: 01/18/2020 5:09:02 PM By: Jimmy Ham MD Entered By: Jimmy Blair on 01/18/2020 15:32:21 -------------------------------------------------------------------------------- Progress Note Details Patient Name: Date of Service: Jimmy, Blair 01/18/2020 2:30 PM Medical Record [id number] Patient Account Number: [id number] Date of Birth/Sex: Treating RN: 1941/01/01 (79 y.o. Jimmy Blair Primary Care Provider: Dion Blair Other Clinician: Referring Provider: Treating Provider/Extender:Kendry Pfarr,  Jimmy Blair, Jimmy Blair in Treatment: 1 Subjective History of Present Illness (HPI) Shepherd HPI Description: 12/06/2019 upon evaluation today patient presents for initial evaluation here in our clinic concerning issues that he is having with his dorsal foot on the left. This secondary to a excessive swelling that occurred pushing on his shoe causing a pressure breakdown. He does have known venous stasis which I think is one of the main issues that has led to the initial swelling and then pressure injury. Nonetheless he seems to be showing some signs of improvement although he still has significant lymphedema and venous stasis as well. He has previously been recommended by vascular to have lymphedema pumps as well as compression stockings they were also rapid him. He will not however wear the compression stockings according to his daughter he also would not use the lymphedema pumps. He told them to send them back. Likewise anytime they wrapped him she states that she will be driving back home and he will be on rapid on the way back home. Obviously I think this is good to be somewhat a compliance issue when it comes to the edema control especially if we end up needing the wrap the patient. It is possible he may heal without the need for wrapping but at the same time does not [id number] certain. If he is not showing signs of good healing and improvement then we will need to consider initiating compression wrapping. The patient otherwise does have a history of again venous insufficiency, lymphedema, and mild cognitive impairment which I think is part of the reason that he is having some issues with compliance most likely. The patient does seem to be pleasant but states that he does not like the compression socks at all. ADMISSION 01/11/2020 This is an 79 year old man who was seen on in our sister clinic in [place] by Jimmy Blair on 2/4; he has a wound on his left dorsal foot which is not  healed. He has since moved to [place] which is an assisted living in [facility] and so he comes to the clinic here today. By review of the records in [place] he has a history of chronic venous insufficiency and lymphedema. His daughter told our intake nurse that he had had a DVT in December 2020 and is now on Xarelto but I do not have any collaborating information on this. I also noticed that there is issues with regards to compliance with wraps although I was  not aware of this when I saw the patient. Nevertheless I do not think it would change the issue he is going to need to be wrapped the heel this wound and to maintain skin integrity. Past medical history includes lymphedema, hypertension, CVA, DVT in December 2020. ABI in our clinic was 1.11 on the left 3/19; patient who has a wound on the left dorsal foot. This looks better to me this week dimensions smaller still some adherent debris on the surface. We have been using Iodoflex under compression. Objective Constitutional Patient is hypertensive.. Pulse regular and within target range for patient.Marland Kitchen Respirations regular, non-labored and within target range.. Temperature is normal and within the target range for the patient.Marland Kitchen Appears in no distress. Vitals Time Taken: 2:21 PM, Height: 72 in, Weight: 165 lbs, BMI: 22.4, Temperature: 98.6 F, Pulse: 70 bpm, Respiratory Rate: 18 breaths/min, Blood Pressure: 147/56 mmHg. General Notes: Wound exam; the area in question is on the left dorsal foot. Under illumination probably 80% tightly adherent debris. Debrided with a #3 curette. I think post debridement things look quite good. No evidence of surrounding infection. Integumentary (Hair, Skin) Wound #1 status is Open. Original cause of wound was Gradually Appeared. The wound is located on the Left,Dorsal Foot. The wound measures 1.5cm length x 3.4cm width x 0.1cm depth; 4.006cm^2 area and 0.401cm^3 volume. There is Fat Layer  (Subcutaneous Tissue) Exposed exposed. There is no tunneling or undermining noted. There is a small amount of serosanguineous drainage noted. The wound margin is flat and intact. There is large (67- 100%) red, pink granulation within the wound bed. There is a small (1-33%) amount of necrotic tissue within the wound bed including Eschar and Adherent Slough. 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 There was a Excisional Skin/Subcutaneous Tissue Debridement with a total area of 5.1 sq cm performed by Jimmy Blair., MD. With the following instrument(s): Curette to remove Viable and Non-Viable tissue/material. Material removed includes Subcutaneous Tissue after achieving pain control using Other (benzocaine, 20%). No specimens were taken. A time out was conducted at 15:19, prior to the start of the procedure. A Minimum amount of bleeding was controlled with Pressure. The procedure was tolerated well with a pain level of 0 throughout and a pain level of 0 following the procedure. Post Debridement Measurements: 1.5cm length x 3.4cm width x 0.1cm depth; 0.401cm^3 volume. Post debridement Stage noted as Category/Stage III. Character of Wound/Ulcer Post Debridement is improved. Post procedure Diagnosis Wound #1: Same as Pre-Procedure Pre-procedure diagnosis of Wound #1 is a Pressure Ulcer located on the Left,Dorsal Foot . There was a Three Layer Compression Therapy Procedure by Deon Pilling, RN. Post procedure Diagnosis Wound #1: Same as Pre-Procedure Plan Follow-up Appointments: Wound #1 Left,Dorsal Foot: Return Appointment in 1 week. Dressing  Change Frequency: Other: - Twice a week, once by wound care center (Fridays) and once by [facility] (Tuesday) Skin Barriers/Peri-Wound Care: Wound #1 Left,Dorsal Foot: Moisturizing lotion Wound Cleansing: Wound #1 Left,Dorsal Foot: May shower with protection. May shower and wash wound with soap and water. - on days that [facility] is changing the dressing Primary Wound Dressing: Wound #1 Left,Dorsal Foot: Iodoflex Secondary Dressing: Wound #1 Left,Dorsal Foot: Dry Gauze Edema Control: 3  Layer Compression System - Left Lower Extremity - use unna boot to top of lower leg to anchor wrap Avoid standing for long periods of time Elevate legs to the level of the heart or above for 30 minutes daily and/or when sitting, a frequency of: Home Health: [facility] skilled nursing for wound care. - [facility], Wound Care Dressing Change 1. I am continuing with the Iodoflex under 3 layer compression 2. There was some concern about home health not putting the wrap up high enough on his leg. We will try to send orders to reflect this. 3. I think the surface of the wound looks better. May be able to change dressings next week Electronic Signature(s) Signed: 01/18/2020 5:09:02 PM By: Jimmy Ham MD Entered By: Jimmy Blair on 01/18/2020 15:36:42 -------------------------------------------------------------------------------- SuperBill Details Patient Name: Date of Service: Jimmy, Blair 01/18/2020 Medical Record [id number] Patient Account Number: [id number] Date of Birth/Sex: Treating RN: [birth year] (79 y.o. Jimmy Blair Primary Care Provider: Dion Blair Other Clinician: Referring Provider: Treating Provider/Extender:Jaloni Davoli, Jimmy Blair, Jimmy Blair in Treatment: 1 Diagnosis Coding ICD-10 Codes Code Description [phone] Chronic venous hypertension (idiopathic) with ulcer of left lower extremity L97.522 Non-pressure chronic ulcer of other part of left foot with fat  layer exposed Z86.718 Personal history of other venous thrombosis and embolism I89.0 Lymphedema, not elsewhere classified Facility Procedures CPT4 Code Description: [id number] - DEB SUBQ TISSUE 20 SQ CM/< ICD-10 Diagnosis Description L97.522 Non-pressure chronic ulcer of other part of left foot with Modifier: fat layer ex Quantity: 1 posed Physician Procedures CPT4 Code Description: [id number] - WC PHYS SUBQ TISS 20 SQ CM ICD-10 Diagnosis Description L97.522 Non-pressure chronic ulcer of other part of left foot with Modifier: fat layer exp Quantity: 1 osed Electronic Signature(s) Signed: 01/18/2020 5:09:02 PM By: Jimmy Ham MD Entered By: Jimmy Blair on 01/18/2020 15:37:11

## 2020-01-25 ENCOUNTER — Encounter (HOSPITAL_BASED_OUTPATIENT_CLINIC_OR_DEPARTMENT_OTHER): Payer: Medicare Other | Admitting: Internal Medicine

## 2020-02-13 NOTE — Progress Notes (Signed)
Jimmy, Blair ([id number]) Visit Report for 01/18/2020 Arrival Information Details Patient Name: Date of Service: Jimmy Blair, Jimmy Blair 01/18/2020 2:30 PM Medical Record [id number] Patient Account Number: [id number] Date of Birth/Sex: Treating RN: [birth year] (79 y.o. Male) Kela Millin Primary Care Francisca Harbuck: Dion Body Other Clinician: Referring Shamal Stracener: Treating Atara Paterson/Extender:Robson, Parks Neptune, Dirk Dress in Treatment: 1 Visit Information History Since Last Visit Added or deleted any medications: No Patient Arrived: Gilford Rile Any new allergies or adverse reactions: No Arrival Time: 14:17 transportation Had a fall or experienced change in No Accompanied By: activities of daily living that may affect Transfer Assistance: None risk of falls: Patient Identification Verified: Yes Signs or symptoms of abuse/neglect since last No Secondary Verification Process Yes visito Completed: Hospitalized since last visit: No Patient Requires Transmission-Based No Implantable device outside of the clinic excluding No Precautions: cellular tissue based products placed in the center Patient Has Alerts: No since last visit: Has Dressing in Place as Prescribed: Yes Pain Present Now: No Electronic Signature(s) Signed: 02/13/2020 9:21:08 AM By: Sandre Kitty Entered By: Sandre Kitty on 01/18/2020 14:18:09 -------------------------------------------------------------------------------- Compression Therapy Details Patient Name: Date of Service: Jimmy Blair, Jimmy Blair 01/18/2020 2:30 PM Medical Record [id number] Patient Account Number: [id number] Date of Birth/Sex: Treating RN: [birth year] (79 y.o. Male) Kela Millin Primary Care Essa Wenk: Dion Body Other Clinician: Referring Nehemie Casserly: Treating Reed Eifert/Extender:Robson, Parks Neptune, Dirk Dress in Treatment: 1 Compression Therapy Performed for Wound Wound #1 Left,Dorsal  Foot Assessment: Performed By: Clinician Deon Pilling, RN Compression Type: Three Layer Post Procedure Diagnosis Same as Pre-procedure Electronic Signature(s) Signed: 01/18/2020 5:10:17 PM By: Kela Millin Entered By: Kela Millin on 01/18/2020 15:17:18 -------------------------------------------------------------------------------- Encounter Discharge Information Details Patient Name: Date of Service: Jimmy Blair, Jimmy Blair 01/18/2020 2:30 PM Medical Record [id number] Patient Account Number: [id number] Date of Birth/Sex: Treating RN: December 21, 1940 (79 y.o. Male) Deon Pilling Primary Care Emlyn Maves: Dion Body Other Clinician: Referring Teoman Giraud: Treating Hadiyah Maricle/Extender:Robson, Parks Neptune, Dirk Dress in Treatment: 1 Encounter Discharge Information Items Post Procedure Vitals Discharge Condition: Stable Temperature (F): 98.6 Ambulatory Status: Walker Pulse (bpm): 70 Discharge Destination: Home Respiratory Rate (breaths/min): 18 Transportation: Private Auto Blood Pressure (mmHg): 147/56 Accompanied By: daughter Schedule Follow-up Appointment: Yes Clinical Summary of Care: Electronic Signature(s) Signed: 01/18/2020 5:19:35 PM By: Deon Pilling Entered By: Deon Pilling on 01/18/2020 15:44:48 -------------------------------------------------------------------------------- Lower Extremity Assessment Details Patient Name: Date of Service: Jimmy, [name] 01/18/2020 2:30 PM Medical Record [id number] Patient Account Number: [id number] Date of Birth/Sex: Treating RN: 04-11-1941 (79 y.o. Male) Baruch Gouty Primary Care Dinh Ayotte: Dion Body Other Clinician: Referring Khushi Zupko: Treating Derrik Mceachern/Extender:Robson, Parks Neptune, Dirk Dress in Treatment: 1 Edema Assessment Assessed: [Left: No] [Right: No] E[Left: dema] [Right: :] Calf Left: Right: Point of Measurement: 44 cm From Medial Instep 42.3 cm cm Ankle Left: Right: Point of  Measurement: 11 cm From Medial Instep 27.6 cm cm Vascular Assessment Pulses: Dorsalis Pedis Palpable: [Left:Yes] Electronic Signature(s) Signed: 01/22/2020 5:59:14 PM By: Baruch Gouty RN, BSN Entered By: Baruch Gouty on 01/18/2020 14:50:44 -------------------------------------------------------------------------------- Multi Wound Chart Details Patient Name: Date of Service: Jimmy Blair, Jimmy Blair 01/18/2020 2:30 PM Medical Record [id number] Patient Account Number: [id number] Date of Birth/Sex: Treating RN: [birth year] (79 y.o. Male) Kela Millin Primary Care Leonard Hendler: Dion Body Other Clinician: Referring Geet Hosking: Treating Asael Pann/Extender:Robson, Parks Neptune, Dirk Dress in Treatment: 1 Vital Signs Height(in): 72 Pulse(bpm): 70 Weight(lbs): 165 Blood Pressure(mmHg): 147/56 Body Mass Index(BMI): 22 Temperature(F): 98.6 Respiratory 18 Rate(breaths/min): Photos: [1:No Photos] [N/A:N/A] Wound Location: [1:Left Foot - Dorsal] [N/A:N/A] Wounding Event: [1:Gradually Appeared] [N/A:N/A] Primary Etiology: [1:Pressure Ulcer] [N/A:N/A] Comorbid History: [1:Hypertension] [N/A:N/A] Date  Acquired: [1:10/02/2019] [N/A:N/A] Weeks of Treatment: [1:1] [N/A:N/A] Wound Status: [1:Open] [N/A:N/A] Measurements L x W x D [1:1.5x3.4x0.1] [N/A:N/A] (cm) Area (cm) : [1:4.006] [N/A:N/A] Volume (cm) : [1:0.401] [N/A:N/A] % Reduction in Area: [1:2.80%] [N/A:N/A] % Reduction in Volume: [1:67.60%] [N/A:N/A] 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] [N/A:N/A] Exudate Amount: [1:Small] [N/A:N/A] Exudate Type: [1:Serosanguineous] [N/A:N/A] Exudate Color: [1:red, brown] [N/A:N/A] Wound Margin: [1:Flat and Intact] [N/A:N/A] Granulation Amount: [1:Large (67-100%)] [N/A:N/A] Granulation Quality: [1:Red, Pink] [N/A:N/A] Necrotic Amount: [1:Small (1-33%)] [N/A:N/A] Necrotic Tissue: [1:Eschar, Adherent Slough] [N/A:N/A] Exposed Structures: [1:Fat Layer (Subcutaneous Tissue)  Exposed: Yes Fascia: No Tendon: No Muscle: No Joint: No Bone: No] [N/A:N/A] Epithelialization: [1:Small (1-33%)] [N/A:N/A] Debridement: [1:Debridement - Excisional] [N/A:N/A] Pre-procedure [1:15:19] [N/A:N/A] Verification/Time Out Taken: Pain Control: [1:Other] [N/A:N/A] Tissue Debrided: [1:Subcutaneous] [N/A:N/A] Level: [1:Skin/Subcutaneous Tissue] [N/A:N/A] Debridement Area (sq cm):5.1 [N/A:N/A] Instrument: [1:Curette] [N/A:N/A] Bleeding: [1:Minimum] [N/A:N/A] Hemostasis Achieved: [1:Pressure] [N/A:N/A] Procedural Pain: [1:0] [N/A:N/A] Post Procedural Pain: [1:0] [N/A:N/A] Debridement Treatment Procedure was tolerated [N/A:N/A] Response: [1:well] Post Debridement [1:1.5x3.4x0.1] [N/A:N/A] Measurements L x W x D (cm) Post Debridement [1:0.401] [N/A:N/A] Volume: (cm) Post Debridement Stage: Category/Stage III [N/A:N/A] Procedures Performed: Compression Therapy [1:Debridement] [N/A:N/A] Treatment Notes Electronic Signature(s) Signed: 01/18/2020 5:09:02 PM By: Linton Ham MD Signed: 01/18/2020 5:10:17 PM By: Kela Millin Entered By: Linton Ham on 01/18/2020 15:32:46 -------------------------------------------------------------------------------- Multi-Disciplinary Care Plan Details Patient Name: Date of Service: Jimmy Blair, Jimmy Blair 01/18/2020 2:30 PM Medical Record [id number] Patient Account Number: [id number] Date of Birth/Sex: Treating RN: [birth year] (79 y.o. Male) Kela Millin Primary Care Jayvien Rowlette: Dion Body Other Clinician: Referring Aubriegh Minch: Treating Antron Seth/Extender:Robson, Parks Neptune, Dirk Dress in Treatment: 1 Active Inactive Pressure Nursing Diagnoses: Knowledge deficit related to causes and risk factors for pressure ulcer development Knowledge deficit related to management of pressures ulcers Goals: Patient/caregiver will verbalize risk factors for pressure ulcer development Date Initiated: 01/11/2020 Target Resolution  Date: 02/15/2020 Goal Status: Active Interventions: Provide education on pressure ulcers Notes: Wound/Skin Impairment Nursing Diagnoses: Impaired tissue integrity Goals: Ulcer/skin breakdown will have a volume reduction of 30% by week 4 Date Initiated: 01/11/2020 Target Resolution Date: 02/15/2020 Goal Status: Active Interventions: Provide education on ulcer and skin care Notes: Electronic Signature(s) Signed: 01/18/2020 5:10:17 PM By: Kela Millin Entered By: Kela Millin on 01/18/2020 15:18:44 -------------------------------------------------------------------------------- Pain Assessment Details Patient Name: Date of Service: Jimmy Blair, Jimmy Blair 01/18/2020 2:30 PM Medical Record [id number] Patient Account Number: [id number] Date of Birth/Sex: Treating RN: [birth year] (78 y.o. Male) Kela Millin Primary Care Mea Ozga: Dion Body Other Clinician: Referring Maddix Kliewer: Treating Estellar Cadena/Extender:Robson, Parks Neptune, Dirk Dress in Treatment: 1 Active Problems Location of Pain Severity and Description of Pain Patient Has Paino No Site Locations Pain Management and Medication Current Pain Management: Electronic Signature(s) Signed: 01/18/2020 5:10:17 PM By: Kela Millin Signed: 02/13/2020 9:21:08 AM By: Sandre Kitty Entered By: Sandre Kitty on 01/18/2020 14:22:09 -------------------------------------------------------------------------------- Patient/Caregiver Education Details Patient Name: Date of Service: JOYCE, Jimmy Blair 3/19/2021andnbsp2:30 PM Medical Record [phone] Patient Account Number: [id number] Date of Birth/Gender: Treating RN: 1940/11/06 (79 y.o. Male) Kela Millin Primary Care Physician: Dion Body Other Clinician: Referring Physician: Treating Physician/Extender:Robson, Parks Neptune, Dirk Dress in Treatment: 1 Education Assessment Education Provided To: Caregiver Education Topics  Provided Pressure: Methods: Explain/Verbal Responses: State content correctly Wound/Skin Impairment: Methods: Explain/Verbal Responses: State content correctly Electronic Signature(s) Signed: 01/18/2020 5:10:17 PM By: Kela Millin Entered By: Kela Millin on 01/18/2020 15:19:01 -------------------------------------------------------------------------------- Wound Assessment Details Patient Name: Date of Service: Jimmy Blair, Jimmy Blair 01/18/2020 2:30 PM Medical Record [id number] Patient Account Number: [id number] Date of Birth/Sex: Treating RN: 12-Jul-1941 (79 y.o. Male) Kela Millin Primary Care  Shyasia Funches: Dion Body Other Clinician: Referring Adis Sturgill: Treating Deara Bober/Extender:Robson, Parks Neptune, Dirk Dress in Treatment: 1 Wound Status Wound Number: 1 Primary Etiology: Pressure Ulcer Wound Location: Left Foot - Dorsal Wound Status: Open Wounding Event: Gradually Appeared Comorbid History: Hypertension Date Acquired: 10/02/2019 Weeks Of Treatment: 1 Clustered Wound: No Photos Photo Uploaded By: Mikeal Hawthorne on 01/21/2020 14:09:16 Wound Measurements Length: (cm) 1.5 Width: (cm) 3.4 Depth: (cm) 0.1 Area: (cm) 4.006 Volume: (cm) 0.401 Wound Description Classification: Category/Stage III Wound Margin: Flat and Intact Exudate Amount: Small Exudate Type: Serosanguineous Exudate Color: red, brown Wound Bed Granulation Amount: Large (67-100%) Granulation Quality: Red, Pink Necrotic Amount: Small (1-33%) Necrotic Quality: Eschar, Adherent Slough Treatment Notes Wound #1 (Left, Dorsal Foot) 1. Cleanse With Wound Cleanser Soap and water 2. Periwound Care Moisturizing lotion 3. Primary Dressing Applied Iodoflex 4. Secondary Dressing Dry Gauze 6. Support Layer Applied 3 layer compression wrap fter Cleansing: No ino Yes Exposed Structure sed: No Subcutaneous Tissue) Exposed: Yes sed: No sed: No ed: No d: No % Reduction in  Area: 2.8% % Reduction in Volume: 67.6% Epithelialization: Small (1-33%) Tunneling: No Undermining: No Foul Odor A Slough/Fibr Fascia Expo Fat Layer ( Tendon Expo Muscle Expo Joint Expos Bone Expose Notes stockings. unna boot first layer used to secure wrapping in place. Electronic Signature(s) Signed: 01/18/2020 5:10:17 PM By: Kela Millin Signed: 01/22/2020 5:59:14 PM By: Baruch Gouty RN, BSN Entered By: Baruch Gouty on 01/18/2020 14:51:26 -------------------------------------------------------------------------------- Vitals Details Patient Name: Date of Service: Jimmy Blair, Jimmy Blair 01/18/2020 2:30 PM Medical Record [id number] Patient Account Number: [id number] Date of Birth/Sex: Treating RN: 1941-04-24 (79 y.o. Male) Kela Millin Primary Care Daija Routson: Dion Body Other Clinician: Referring Dawn Convery: Treating Brynlyn Dade/Extender:Robson, Parks Neptune, Dirk Dress in Treatment: 1 Vital Signs Time Taken: 14:21 Temperature (F): 98.6 Height (in): 72 Pulse (bpm): 70 Weight (lbs): 165 Respiratory Rate (breaths/min): 18 Body Mass Index (BMI): 22.4 Blood Pressure (mmHg): 147/56 Reference Range: 80 - 120 mg / dl Electronic Signature(s) Signed: 02/13/2020 9:21:08 AM By: Sandre Kitty Entered By: Sandre Kitty on 01/18/2020 14:22:06

## 2020-02-25 ENCOUNTER — Encounter (HOSPITAL_BASED_OUTPATIENT_CLINIC_OR_DEPARTMENT_OTHER): Payer: Medicare Other | Attending: Internal Medicine | Admitting: Internal Medicine

## 2020-02-25 ENCOUNTER — Encounter (HOSPITAL_BASED_OUTPATIENT_CLINIC_OR_DEPARTMENT_OTHER): Payer: Medicare Other | Admitting: Internal Medicine

## 2020-04-12 IMAGING — US US EXTREM LOW VENOUS*L*
1 series · 13 of 24 positions shown · non-contrast
Comparison: None.

CLINICAL DATA: Left leg swelling for 1 week



[Series 1: us extrem low venous*left* · 0.06mm/px · 13 of 38 slices shown]
[im 1/38]
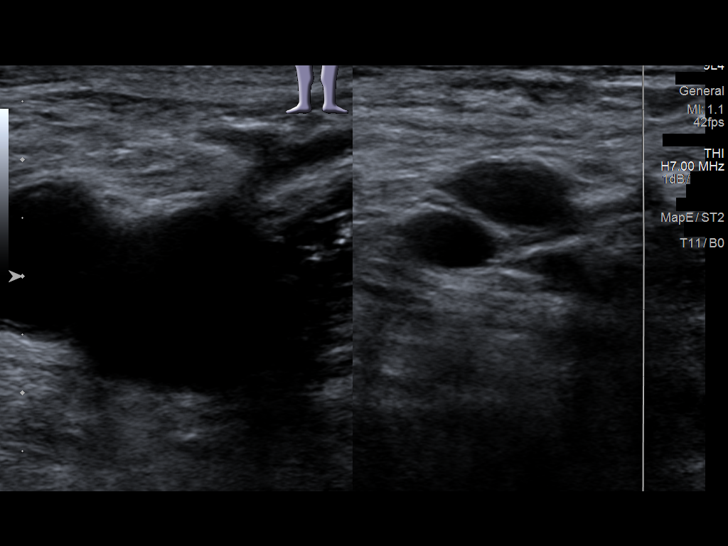
[im 4/38]
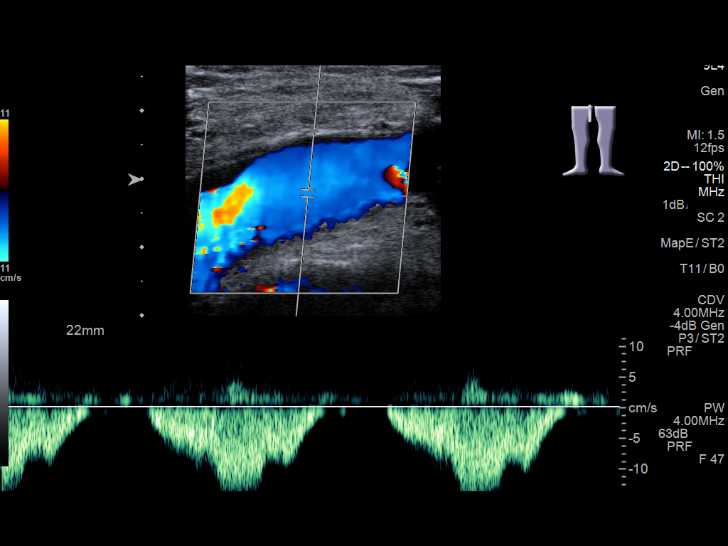
[im 7/38]
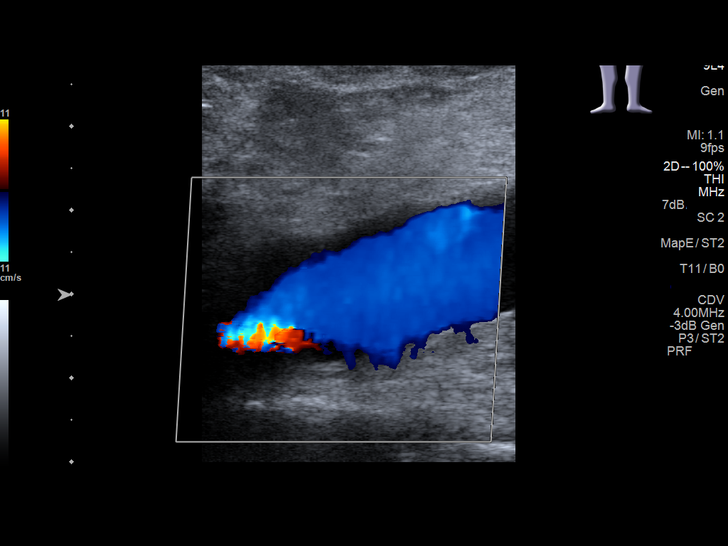
[im 10/38]
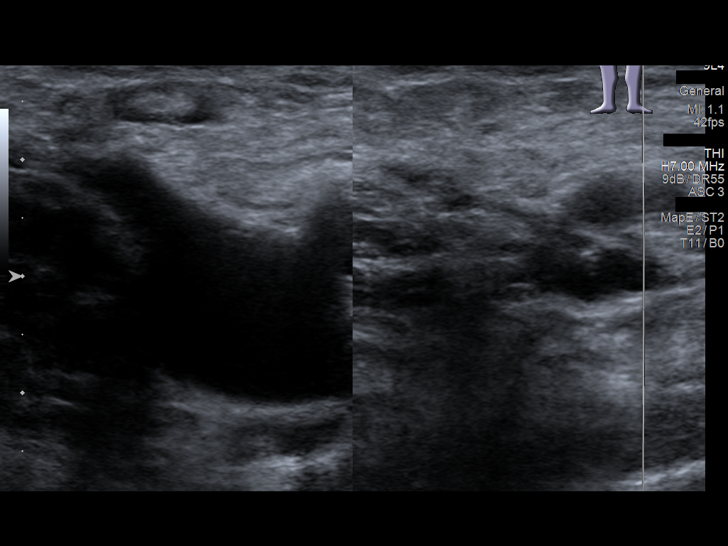
[im 13/38]
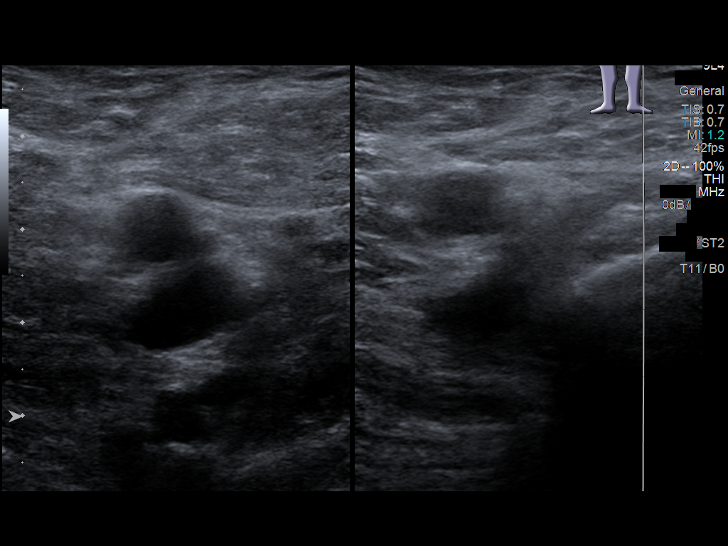
[im 17/38]
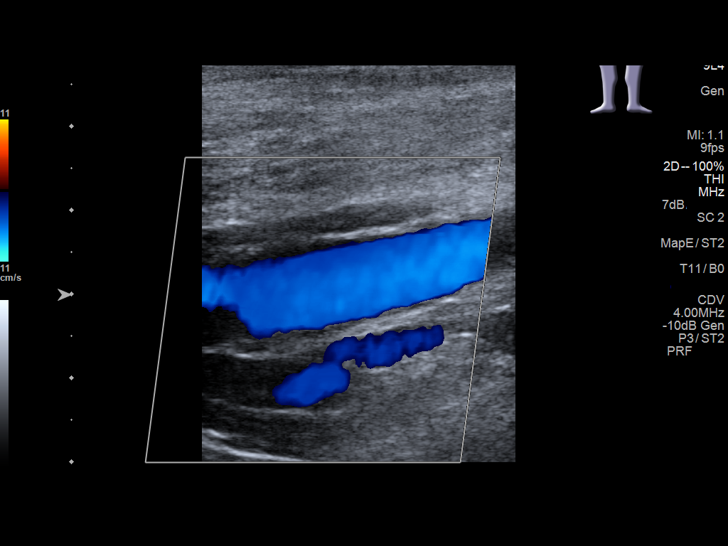
[im 20/38]
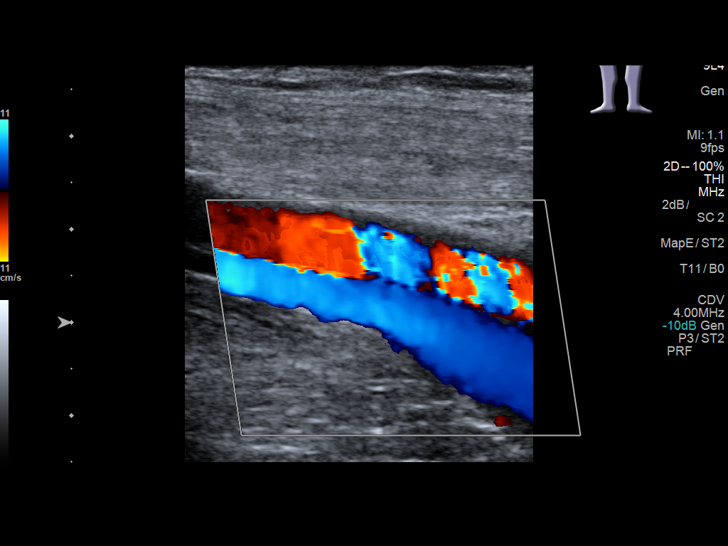
[im 21/38]
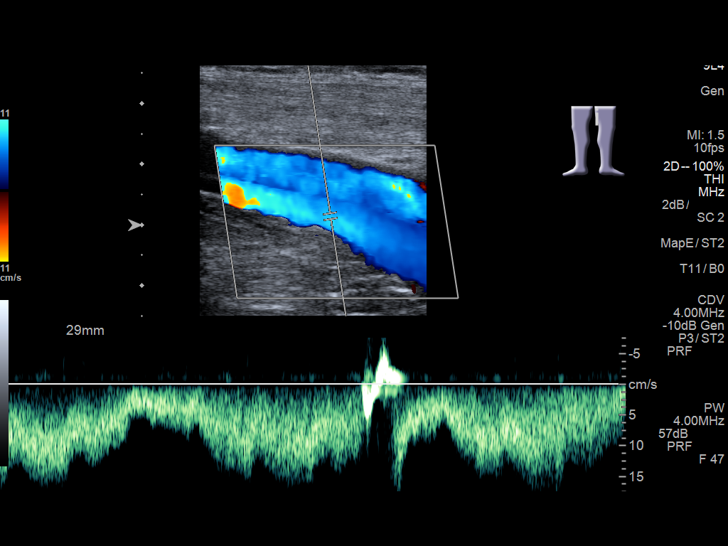
[im 25/38]
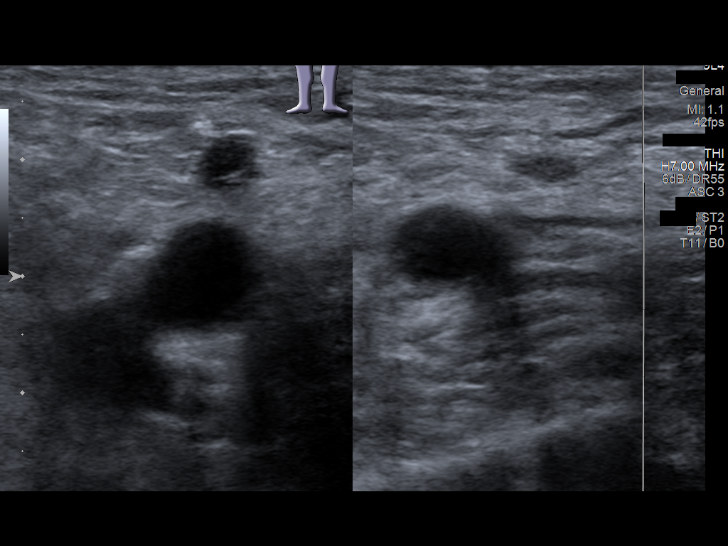
[im 28/38]
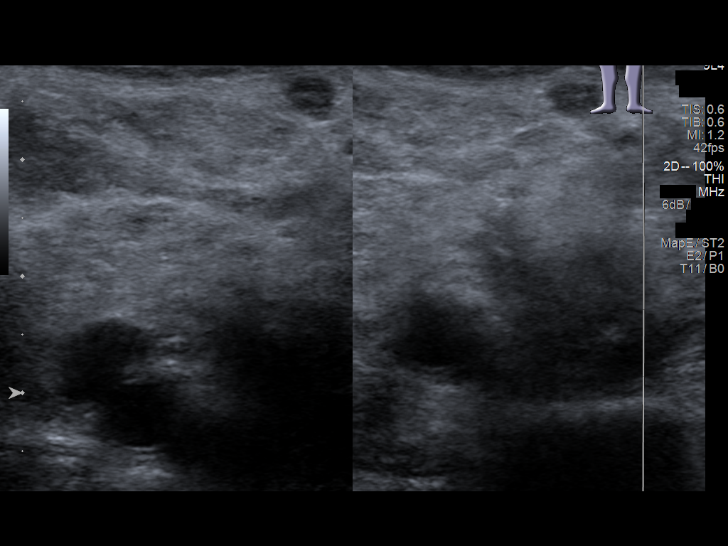
[im 31/38]
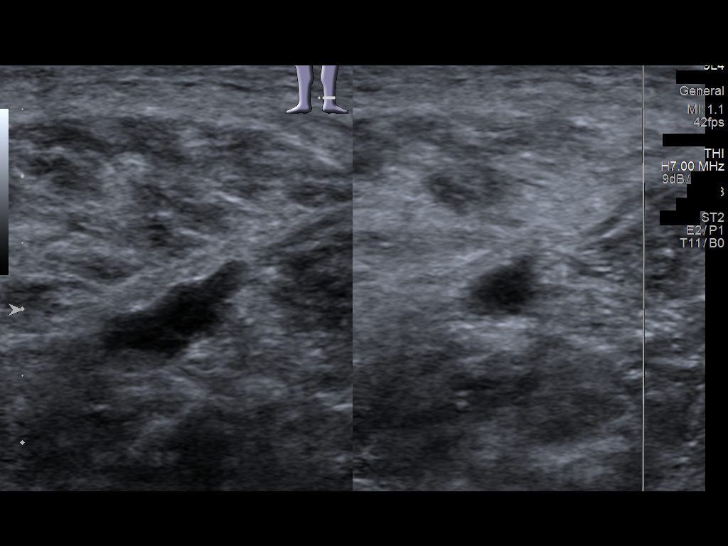
[im 34/38]
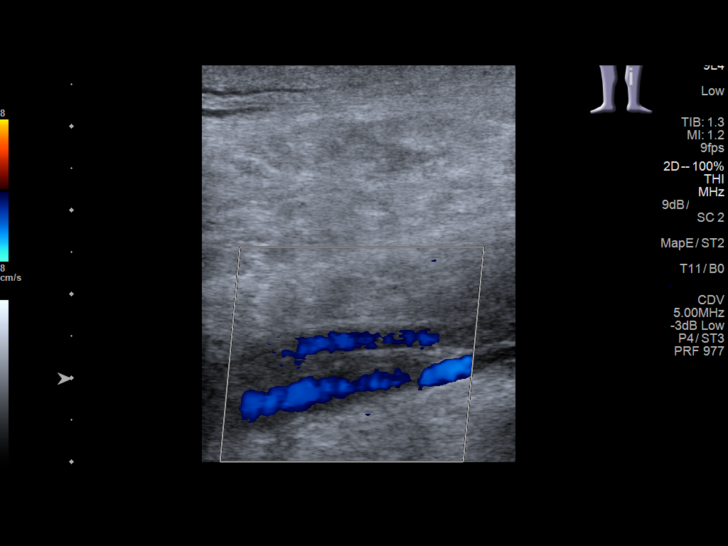
[im 38/38]
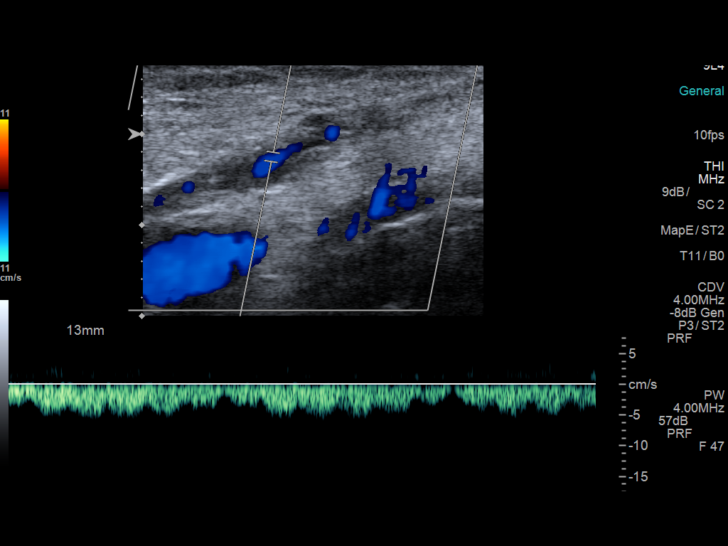

[13 of 24 positions shown; findings below may reference images not displayed]

FINDINGS: Contralateral Common Femoral Vein: Respiratory phasicity is normal
and symmetric with the symptomatic side. No evidence of thrombus.
Normal compressibility.

Common Femoral Vein: No evidence of thrombus. Normal
compressibility, respiratory phasicity and response to augmentation.

Saphenofemoral Junction: No evidence of thrombus. Normal
compressibility and flow on color Doppler imaging.

Profunda Femoral Vein: No evidence of thrombus. Normal
compressibility and flow on color Doppler imaging.

Femoral Vein: No evidence of thrombus. Normal compressibility,
respiratory phasicity and response to augmentation.

Popliteal Vein: No evidence of thrombus. Normal compressibility,
respiratory phasicity and response to augmentation.

Calf Veins: No evidence of thrombus. Normal compressibility and flow
on color Doppler imaging.

Superficial Great Saphenous Vein: No evidence of thrombus. Normal
compressibility.

Venous Reflux:  None.

Other Findings: Nonocclusive thrombus is noted in the lesser
saphenous vein.
IMPRESSION: Nonocclusive superficial thrombus within the lesser saphenous vein.

No evidence of deep venous thrombosis.

## 2020-10-29 IMAGING — US VENOUS DOPPLER ULTRASOUND OF LEFT LOWER EXTREMITY
1 series · 13 of 24 positions shown · non-contrast
Comparison: Left lower extremity venous Doppler
ultrasound-11/27/2018

CLINICAL DATA: Left lower extremity edema for the past 2 months.
History superficial thrombophlebitis. Evaluate for acute or chronic
DVT/SVT.



[Series 1: venous doppler ultrasound of left lower extremity · 0.08mm/px · 13 of 47 slices shown]
[im 1/47]
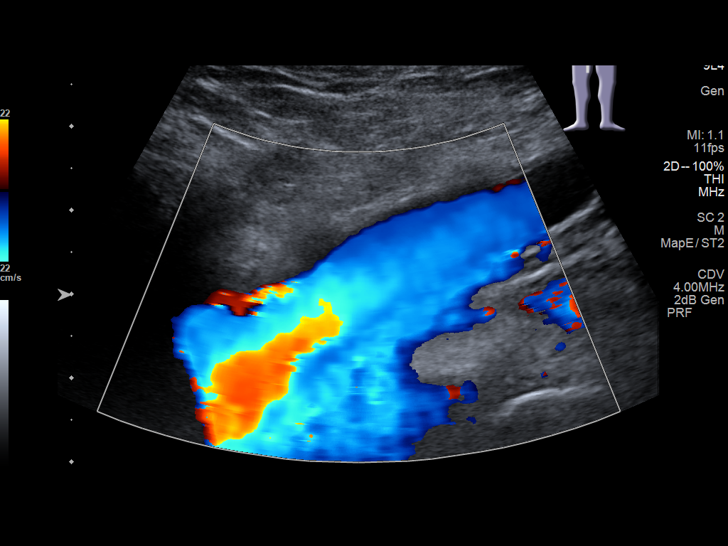
[im 5/47]
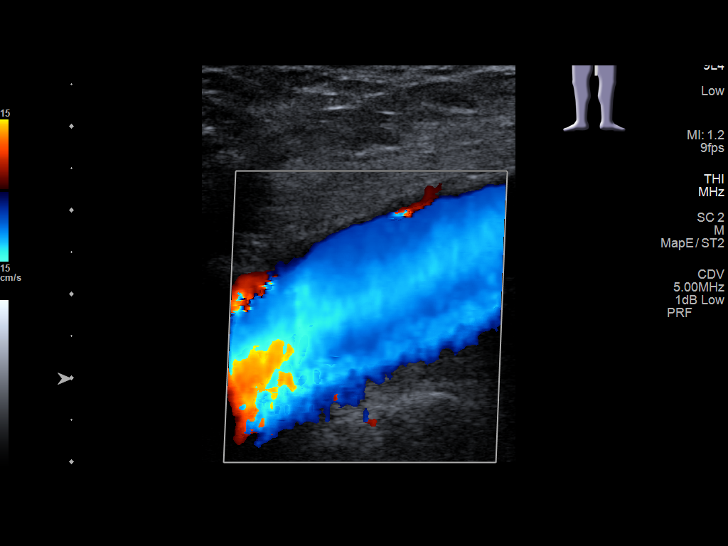
[im 9/47]
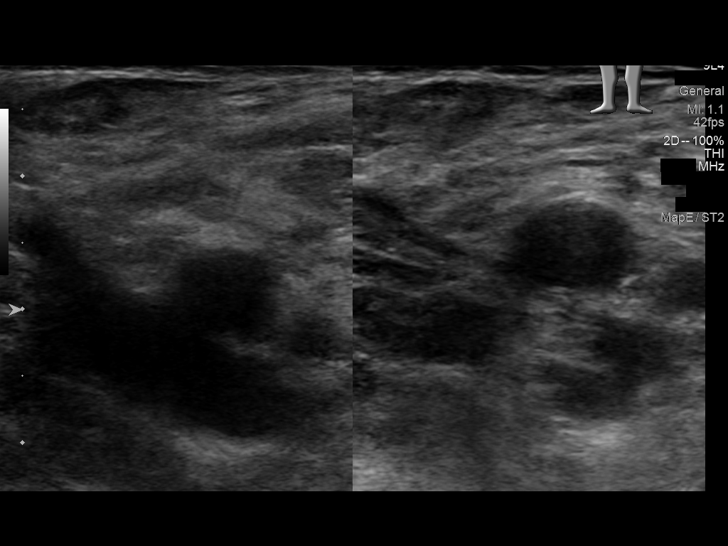
[im 13/47]
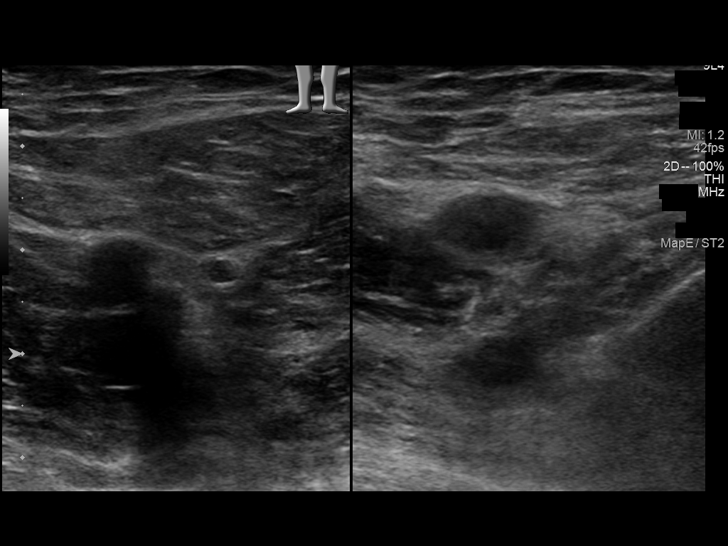
[im 17/47]
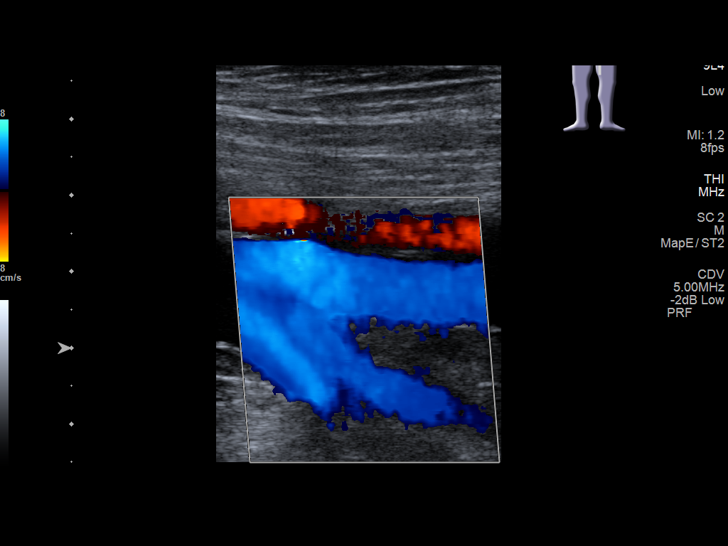
[im 21/47]
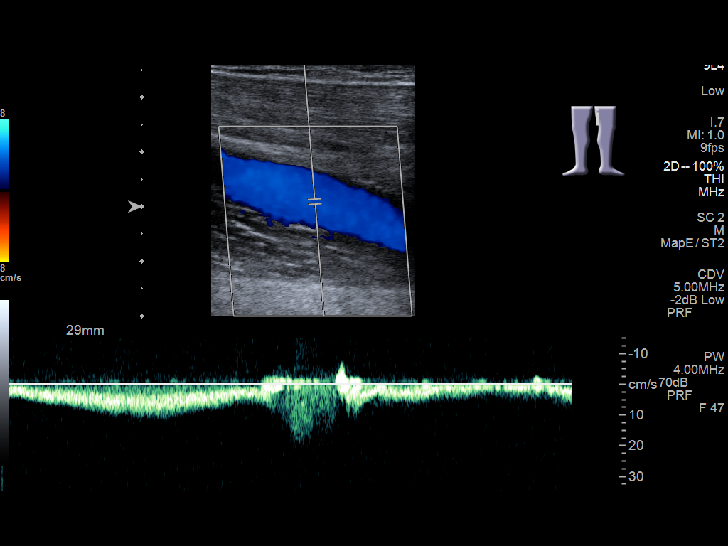
[im 25/47]
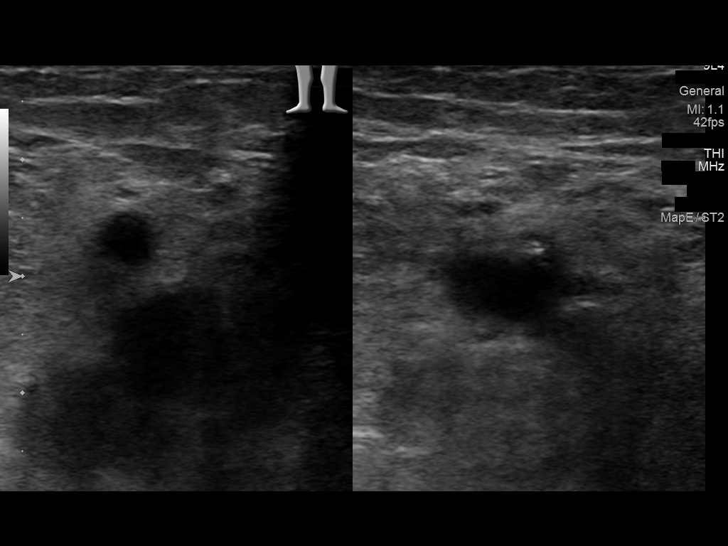
[im 27/47]
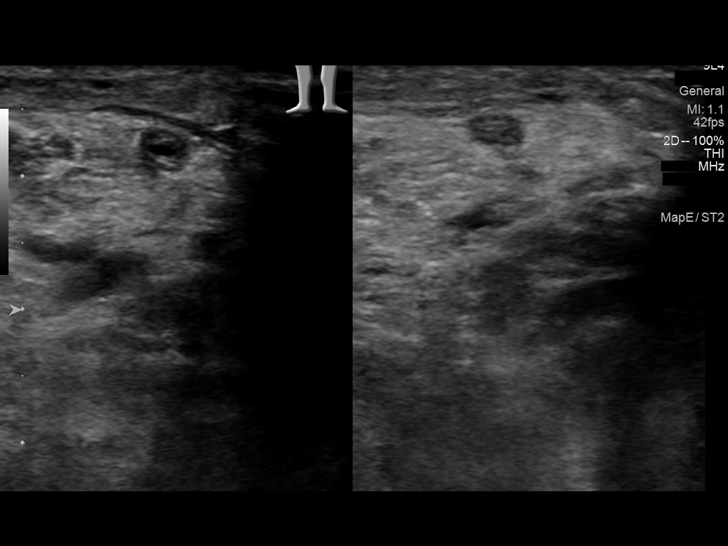
[im 31/47]
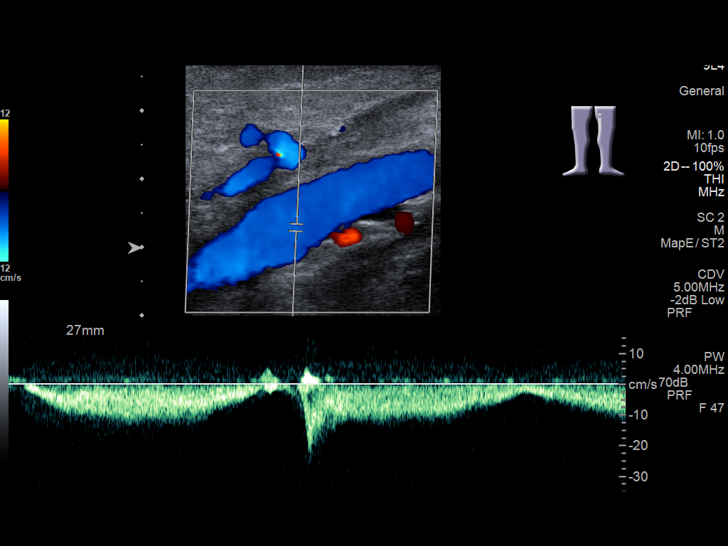
[im 35/47]
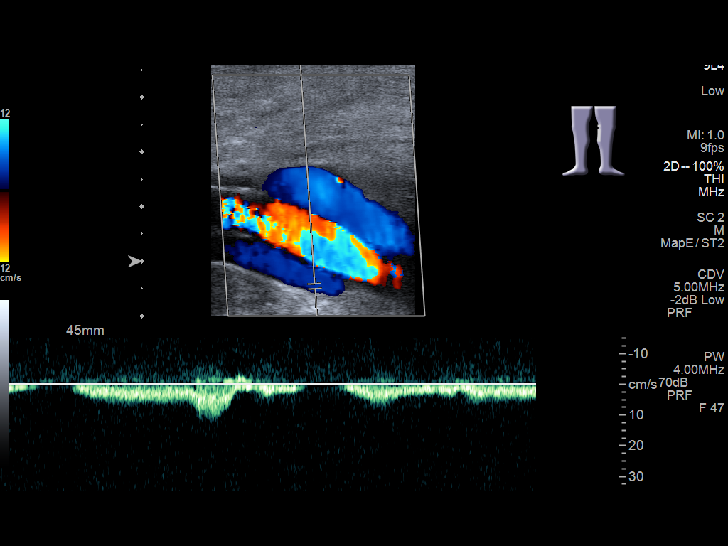
[im 39/47]
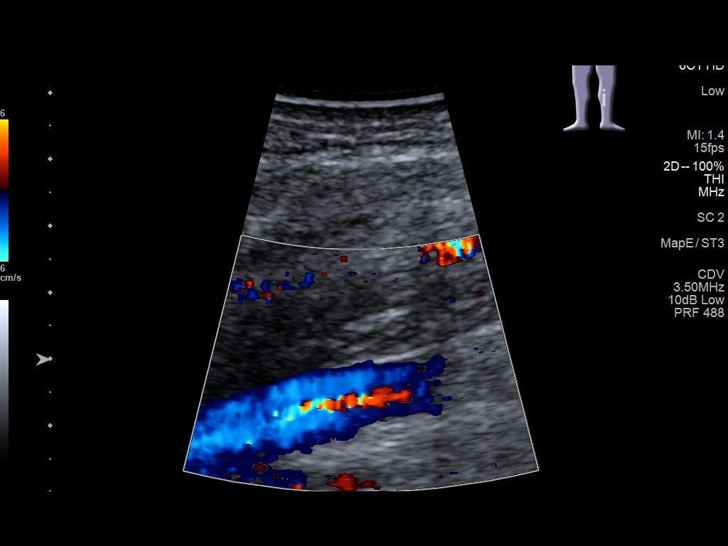
[im 43/47]
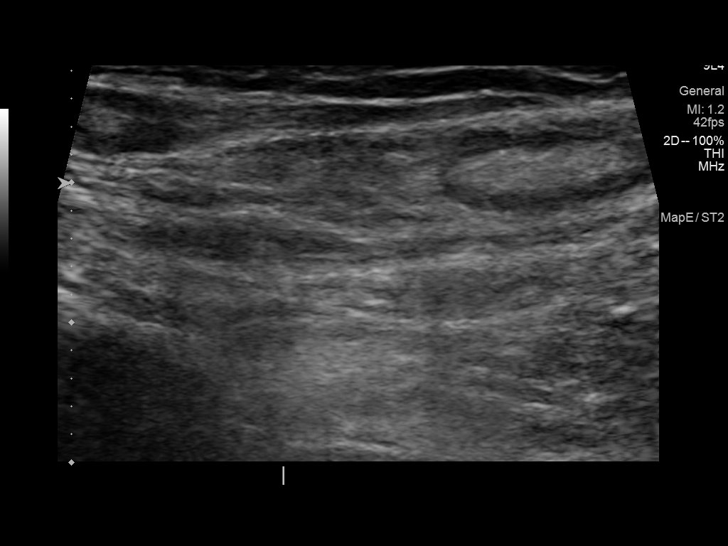
[im 47/47]
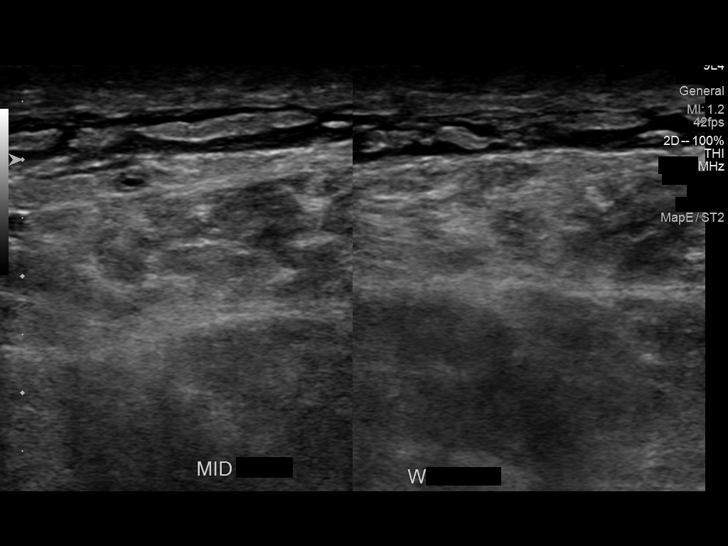

[13 of 24 positions shown; findings below may reference images not displayed]

FINDINGS: Contralateral Common Femoral Vein: Respiratory phasicity is normal
and symmetric with the symptomatic side. No evidence of thrombus.
Normal compressibility.

Common Femoral Vein: No evidence of thrombus. Normal
compressibility, respiratory phasicity and response to augmentation.

Saphenofemoral Junction: No evidence of thrombus. Normal
compressibility and flow on color Doppler imaging.

Profunda Femoral Vein: No evidence of thrombus. Normal
compressibility and flow on color Doppler imaging.

Femoral Vein: No evidence of thrombus. Normal compressibility,
respiratory phasicity and response to augmentation.

Popliteal Vein: No evidence of thrombus. Normal compressibility,
respiratory phasicity and response to augmentation.

Calf Veins: No evidence of thrombus. Normal compressibility and flow
on color Doppler imaging.

Superficial Great Saphenous Vein: No evidence of thrombus. Normal
compressibility.

Venous Reflux:  None.

Other Findings: Mixed echogenic near occlusive thrombus is seen
within the left lesser saphenous vein (images 30, 31, 33), slightly
improved compared to the [DATE] examination.

Benign appearing left inguinal lymph nodes are incidentally noted
with index left inguinal lymph node measuring 1 cm in greatest short
axis diameter and maintaining a benign fatty hilum (44).
IMPRESSION: 1. No evidence of acute or chronic DVT within the left lower
extremity.
2. Chronic near occlusive superficial thrombophlebitis involving the
left lesser saphenous vein, minimally improved compared to the
[DATE] examination, and again without extension to the deep venous
system of the left lower extremity.
3. No evidence of acute SVT within the left lower extremity.

## 2020-12-02 DEATH — deceased

## 2021-04-18 IMAGING — US US EXTREM LOW VENOUS*L*
1 series · 13 of 24 positions shown · non-contrast
Comparison: May 14, 2019.

CLINICAL DATA: Chronic left lower extremity pain and swelling.



[Series 1: us extrem low venous*left* · 13 of 40 slices shown]
[im 1/40]
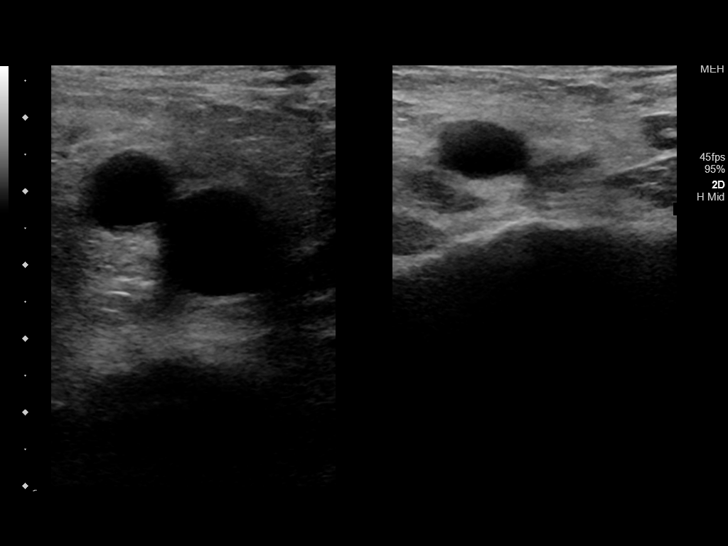
[im 4/40]
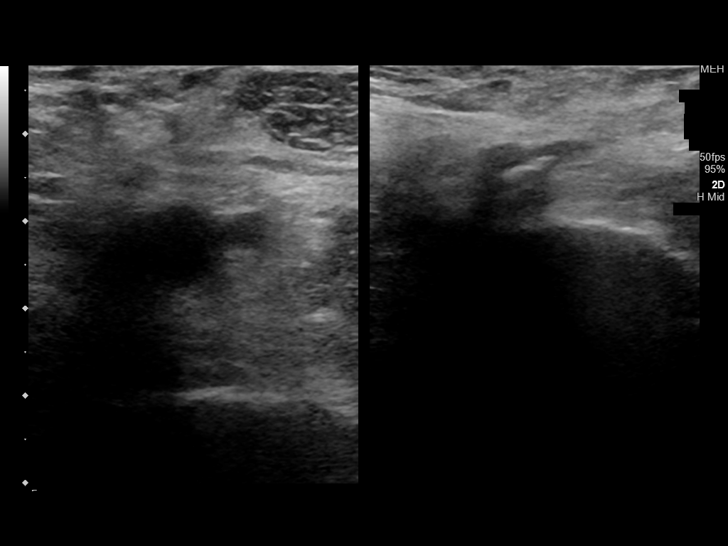
[im 7/40]
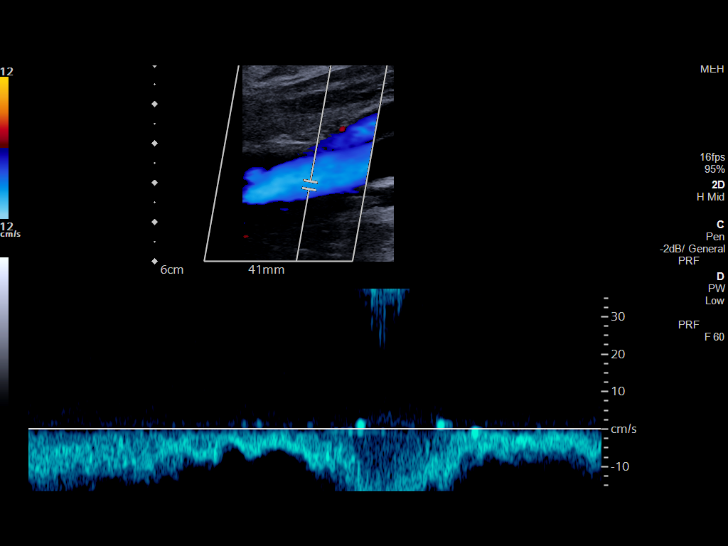
[im 11/40]
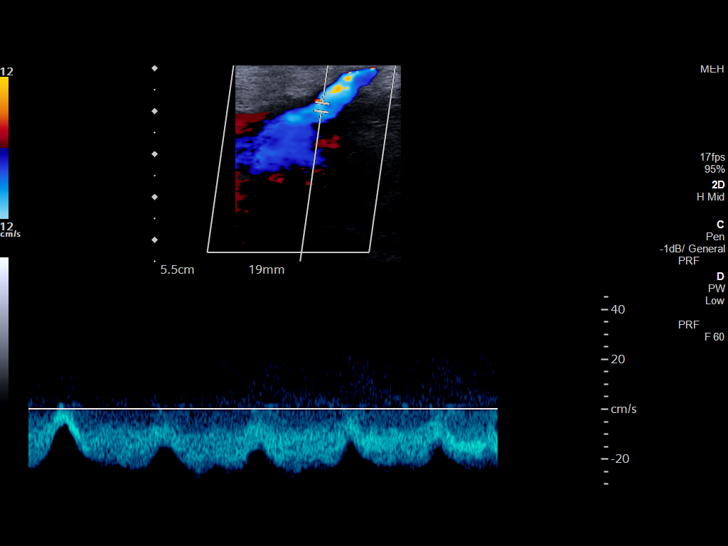
[im 14/40]
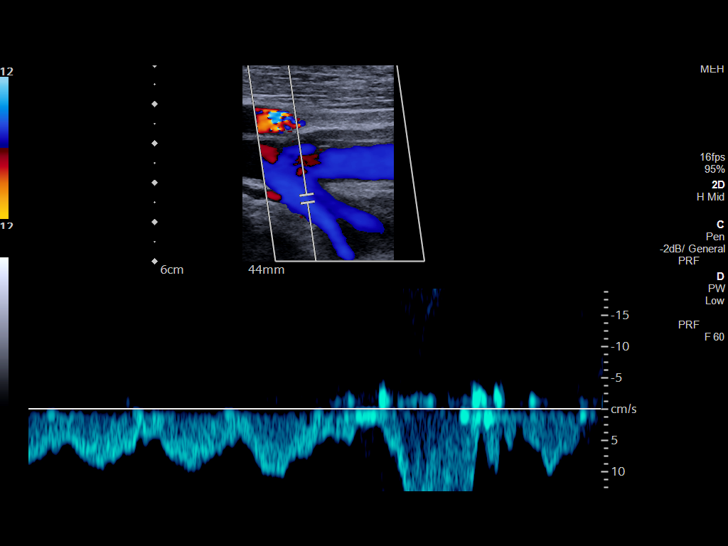
[im 17/40]
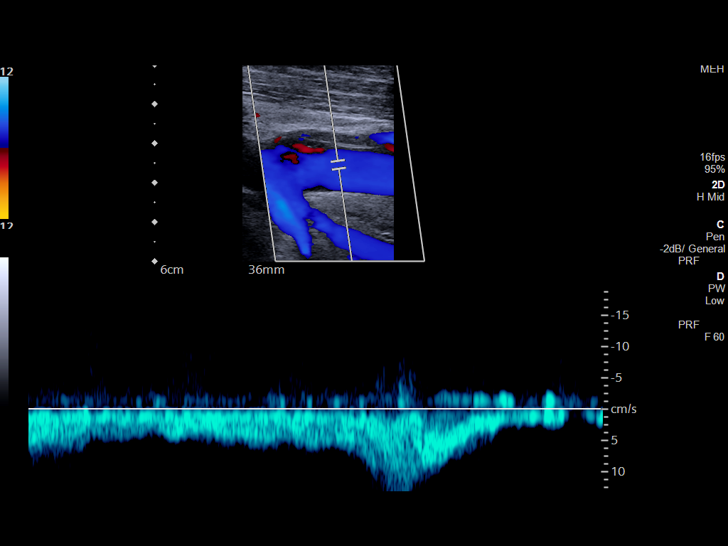
[im 21/40]
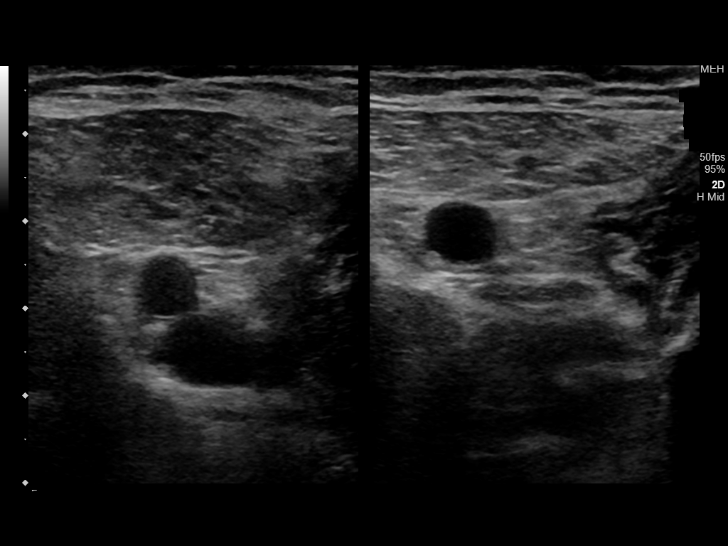
[im 23/40]
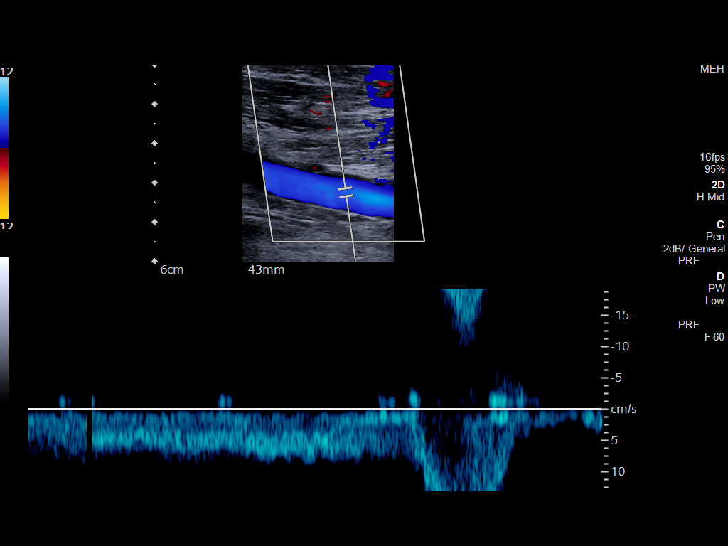
[im 26/40]
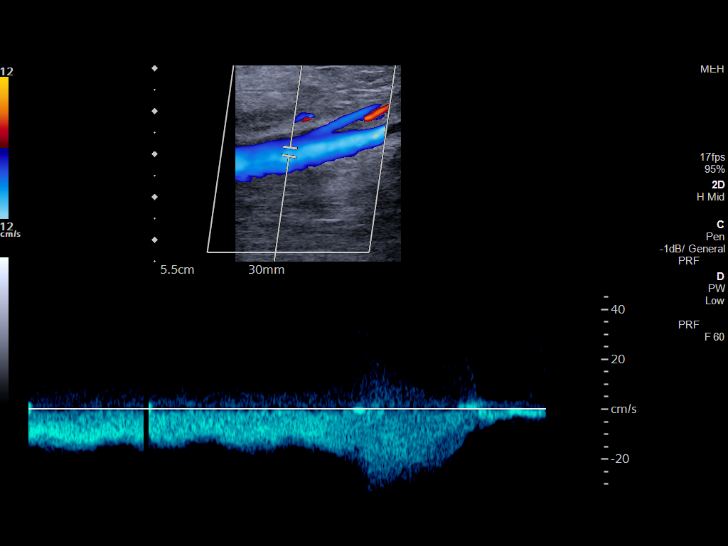
[im 29/40]
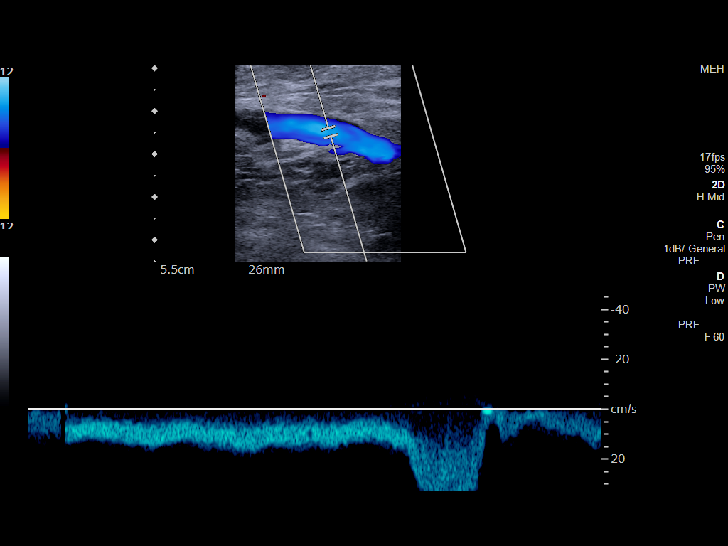
[im 33/40]
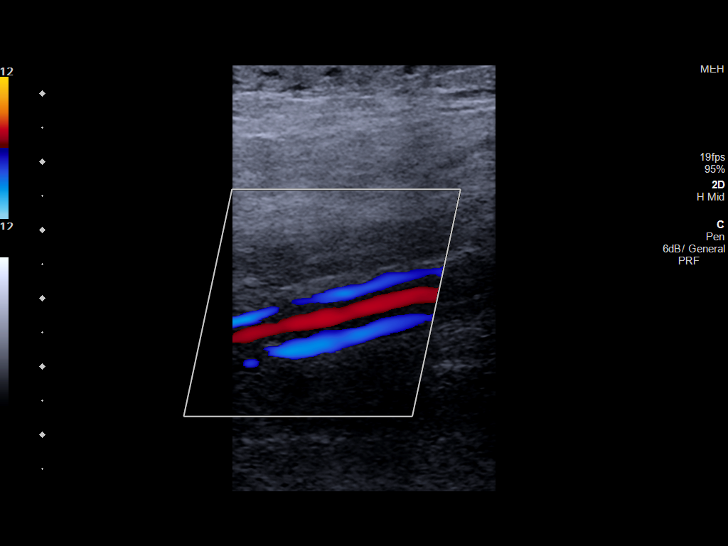
[im 36/40]
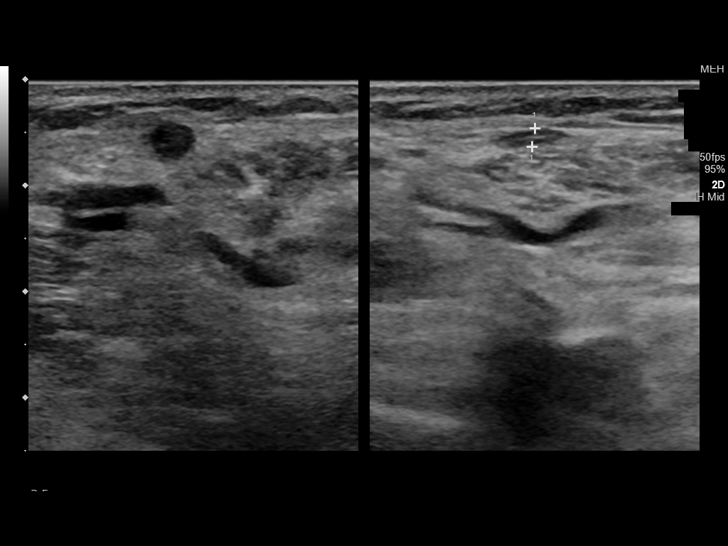
[im 40/40]
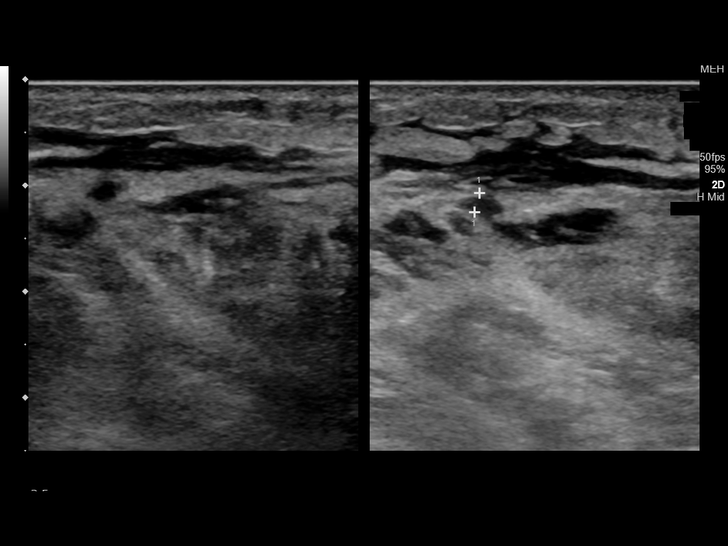

[13 of 24 positions shown; findings below may reference images not displayed]

FINDINGS: Contralateral Common Femoral Vein: Respiratory phasicity is normal
and symmetric with the symptomatic side. No evidence of thrombus.
Normal compressibility.

Common Femoral Vein: No evidence of thrombus. Normal
compressibility, respiratory phasicity and response to augmentation.

Saphenofemoral Junction: No evidence of thrombus. Normal
compressibility and flow on color Doppler imaging.

Profunda Femoral Vein: No evidence of thrombus. Normal
compressibility and flow on color Doppler imaging.

Femoral Vein: No evidence of thrombus. Normal compressibility,
respiratory phasicity and response to augmentation.

Popliteal Vein: No evidence of thrombus. Normal compressibility,
respiratory phasicity and response to augmentation.

Calf Veins: No evidence of thrombus. Normal compressibility and flow
on color Doppler imaging.

Venous Reflux:  None.

Other Findings: Nonocclusive thrombus is noted in the left lesser
saphenous vein which was noted on prior exam. It does not appear to
be significantly changed.
IMPRESSION: No evidence of deep venous thrombosis seen in left lower extremity.
Continued presence of nonocclusive thrombus seen in the left lesser
saphenous vein consistent with superficial thrombophlebitis.
